# Patient Record
Sex: Male | Born: 1970
Health system: Southern US, Community
[De-identification: ages and names within clinical notes are randomized; demographics above are authoritative.]

## PROBLEM LIST (undated history)

## (undated) DIAGNOSIS — R011 Cardiac murmur, unspecified: Secondary | ICD-10-CM

## (undated) DIAGNOSIS — T7840XA Allergy, unspecified, initial encounter: Secondary | ICD-10-CM

## (undated) DIAGNOSIS — I10 Essential (primary) hypertension: Secondary | ICD-10-CM

## (undated) DIAGNOSIS — K4090 Unilateral inguinal hernia, without obstruction or gangrene, not specified as recurrent: Secondary | ICD-10-CM

## (undated) HISTORY — PX: OTHER SURGICAL HISTORY: SHX169

## (undated) HISTORY — DX: Allergy, unspecified, initial encounter: T78.40XA

## (undated) HISTORY — PX: DENTAL SURGERY: SHX609

## (undated) HISTORY — PX: MOUTH SURGERY: SHX715

## (undated) HISTORY — DX: Cardiac murmur, unspecified: R01.1

## (undated) HISTORY — PX: FINGER FRACTURE SURGERY: SHX638

## (undated) HISTORY — DX: Essential (primary) hypertension: I10

## (undated) HISTORY — PX: WRIST FRACTURE SURGERY: SHX121

## (undated) HISTORY — DX: Unilateral inguinal hernia, without obstruction or gangrene, not specified as recurrent: K40.90

---

## 2012-12-22 ENCOUNTER — Ambulatory Visit (INDEPENDENT_AMBULATORY_CARE_PROVIDER_SITE_OTHER): Payer: 59 | Admitting: Family Medicine

## 2012-12-22 ENCOUNTER — Encounter: Payer: Self-pay | Admitting: Family Medicine

## 2012-12-22 VITALS — BP 129/89 | HR 70 | Resp 14 | Ht 71.0 in | Wt 220.0 lb

## 2012-12-22 DIAGNOSIS — R011 Cardiac murmur, unspecified: Secondary | ICD-10-CM

## 2012-12-22 DIAGNOSIS — L255 Unspecified contact dermatitis due to plants, except food: Secondary | ICD-10-CM

## 2012-12-22 DIAGNOSIS — Z283 Underimmunization status: Secondary | ICD-10-CM

## 2012-12-22 DIAGNOSIS — Z13220 Encounter for screening for lipoid disorders: Secondary | ICD-10-CM

## 2012-12-22 DIAGNOSIS — L237 Allergic contact dermatitis due to plants, except food: Secondary | ICD-10-CM | POA: Insufficient documentation

## 2012-12-22 DIAGNOSIS — Z131 Encounter for screening for diabetes mellitus: Secondary | ICD-10-CM

## 2012-12-22 MED ORDER — TRIAMCINOLONE ACETONIDE 0.1 % EX CREA: TOPICAL_CREAM | Freq: Two times a day (BID) | CUTANEOUS | Status: DC

## 2012-12-22 NOTE — Progress Notes (Signed)
CC: Tyler Moody is a 42 y.o. 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 Discomfort is described as itching moderate severity, slightly improved with over-the-counter hydrocortisone cream. He has gotten this multiple times during the spring and summer when exposed to poison ivy.  Localizes it to both sides of the neck and left flank.  Worsens with scratching nothing else makes better or worse. Symptoms have not gotten better or worse overall since onset.  He denies trouble swallowing, throat closure, swelling of the face, nor difficulty breathing  Patient is unsure whether or not he has had a tetanus booster in the last 10 years  He believes it's been well over 2 years since cholesterol and blood sugar was checked  Review of Systems - General ROS: negative for - chills, fever, night sweats, weight gain or weight loss Ophthalmic ROS: negative for - decreased vision Psychological ROS: negative for - anxiety or depression ENT ROS: negative for - hearing change, nasal congestion, tinnitus or allergies Hematological and Lymphatic ROS: negative for - bleeding problems, bruising or swollen lymph nodes Breast ROS: negative Respiratory ROS: no cough, shortness of breath, or wheezing Cardiovascular ROS: no chest pain or dyspnea on exertion Gastrointestinal ROS: no abdominal pain, change in bowel habits, or black or bloody stools Genito-Urinary ROS: negative for - genital discharge, genital ulcers, incontinence or abnormal bleeding from genitals Musculoskeletal ROS: negative for - joint pain or muscle pain Neurological ROS: negative for - headaches or memory loss Dermatological ROS: negative for lumps, mole changes, rash and skin lesion changes other than that described in history of present illness  Past Medical History  Diagnosis Date  .  Heart murmur      Family History  Problem Relation Age of Onset  . Cancer Mother     breast CA  . Hypertension Father      History  Substance Use Topics  . Smoking status: Never Smoker   . Smokeless tobacco: Not on file  . Alcohol Use: .5 - 1 oz/week    1-2 drink(s) per week     Objective: Filed Vitals:   12/22/12 0847  BP: 129/89  Pulse: 70  Resp: 14    General: Alert and Oriented, No Acute Distress HEENT: Pupils equal, round, reactive to light. Conjunctivae clear.  Moist mucous membranes pharynx unremarkable. No cervical lymphadenopathy Lungs: Clear to auscultation bilaterally, no wheezing/ronchi/rales.  Comfortable work of breathing. Good air movement. Cardiac: Regular rate and rhythm. Normal S1/S2.  Grade 1/6 holosystolic murmur no, rubs, nor gallops.   Extremities: No peripheral edema.  Strong peripheral pulses.  Mental Status: No depression, anxiety, nor agitation. Skin: Warm and dry. Moderate erythema and closely clustered vesicular lesions on the Anterior, left, right sparing the back of the neck, no sign of infection  Assessment & Plan: Tyler Moody was seen today for establish care.  Diagnoses and associated orders for this visit:  Poison ivy dermatitis - triamcinolone cream (KENALOG) 0.1 %; Apply topically 2 (two) times daily. For two weeks.  Heart murmur  Immunization deficiency  Screening, lipid - Lipid panel  Screening for diabetes mellitus - BASIC METABOLIC PANEL WITH GFR    Poison ivy: Patient does not want to be aggressive to the point of starting prednisone but open to the idea of topical triamcinolone. Immunization deficiency: Will wait for outside records prior to Tdap  administration. He is due for a lipid screening and diabetic screening  Return in about 4 weeks (around 01/19/2013) for CPE.

## 2012-12-23 ENCOUNTER — Telehealth: Payer: Self-pay | Admitting: *Deleted

## 2012-12-23 ENCOUNTER — Ambulatory Visit: Payer: Self-pay | Admitting: Family Medicine

## 2012-12-23 DIAGNOSIS — L237 Allergic contact dermatitis due to plants, except food: Secondary | ICD-10-CM

## 2012-12-23 MED ORDER — PREDNISONE 20 MG PO TABS: ORAL_TABLET | ORAL | Status: AC

## 2012-12-23 NOTE — Addendum Note (Signed)
Addended by: Laren Boom on: 12/23/2012 11:42 AM   Modules accepted: Orders

## 2012-12-23 NOTE — Telephone Encounter (Signed)
Pt calls office back and would like to get a rx for prednisone for poison ivy would like this sent to [facility] at [facility]

## 2012-12-23 NOTE — Telephone Encounter (Signed)
Left message to notified pt RX sent to his pharmacy. Fidela Cieslak,LPN

## 2012-12-23 NOTE — Telephone Encounter (Signed)
Pt called reports that his poison ivy is no better, he would like PO prednisone?/ Tyler Celani,LPN

## 2012-12-23 NOTE — Telephone Encounter (Signed)
Oral prednisone taper has been sent to requesting pharmacy.

## 2012-12-24 LAB — LIPID PANEL
Cholesterol: 128 mg/dL (ref 0–200)
LDL Cholesterol: 74 mg/dL (ref 0–99)
Total CHOL/HDL Ratio: 3.4 Ratio
VLDL: 16 mg/dL (ref 0–40)

## 2012-12-24 LAB — BASIC METABOLIC PANEL WITH GFR
BUN: 17 mg/dL (ref 6–23)
Chloride: 102 mEq/L (ref 96–112)
GFR, Est African American: 79 mL/min
GFR, Est Non African American: 69 mL/min
Potassium: 4 mEq/L (ref 3.5–5.3)
Sodium: 138 mEq/L (ref 135–145)

## 2012-12-25 ENCOUNTER — Encounter: Payer: Self-pay | Admitting: Family Medicine

## 2012-12-25 DIAGNOSIS — N2889 Other specified disorders of kidney and ureter: Secondary | ICD-10-CM | POA: Insufficient documentation

## 2012-12-25 DIAGNOSIS — N182 Chronic kidney disease, stage 2 (mild): Secondary | ICD-10-CM | POA: Insufficient documentation

## 2013-01-06 ENCOUNTER — Encounter: Payer: Self-pay | Admitting: Family Medicine

## 2013-01-06 ENCOUNTER — Ambulatory Visit (INDEPENDENT_AMBULATORY_CARE_PROVIDER_SITE_OTHER): Payer: 59 | Admitting: Family Medicine

## 2013-01-06 VITALS — BP 145/95 | HR 66 | Ht 71.0 in | Wt 222.0 lb

## 2013-01-06 DIAGNOSIS — K4090 Unilateral inguinal hernia, without obstruction or gangrene, not specified as recurrent: Secondary | ICD-10-CM

## 2013-01-06 DIAGNOSIS — R030 Elevated blood-pressure reading, without diagnosis of hypertension: Secondary | ICD-10-CM

## 2013-01-06 DIAGNOSIS — Z0000 Encounter for general adult medical examination without abnormal findings: Secondary | ICD-10-CM

## 2013-01-06 HISTORY — DX: Unilateral inguinal hernia, without obstruction or gangrene, not specified as recurrent: K40.90

## 2013-01-06 NOTE — Progress Notes (Signed)
CC: Tyler Moody is a 42 y.o. male is here for Annual Exam   Subjective: HPI:  Colonoscopy: No family history colon cancer. Will start age 66. Prostate: Discussed screening risks/beneifts with patient on 01/06/2013. No lower urinary tract symptoms, we'll begin screening at age 31  Influenza Vaccine: Out of season Pneumovax: No indication currently Td/Tdap: He is unsure of immunization status believes it was within last 10 years, we are awaiting outside records Zoster: (Start 42 yo)  No acute complaints today here for complete physical exam. No tobacco use, rare alcohol use, no recreational drug use. He is getting started on a daily aerobic exercise regimen, he is quite particular about eating healthy.  Over the past 2 weeks have you been bothered by: - Little interest or pleasure in doing things: No - Feeling down depressed or hopeless: No  Review of Systems - General ROS: negative for - chills, fever, night sweats, weight gain or weight loss Ophthalmic ROS: negative for - decreased vision Psychological ROS: negative for - anxiety or depression ENT ROS: negative for - hearing change, nasal congestion, tinnitus or allergies Hematological and Lymphatic ROS: negative for - bleeding problems, bruising or swollen lymph nodes Breast ROS: negative Respiratory ROS: no cough, shortness of breath, or wheezing Cardiovascular ROS: no chest pain or dyspnea on exertion Gastrointestinal ROS: no abdominal pain, change in bowel habits, or black or bloody stools Genito-Urinary ROS: negative for - genital discharge, genital ulcers, incontinence or abnormal bleeding from genitals Musculoskeletal ROS: negative for - joint pain or muscle pain Neurological ROS: negative for - headaches or memory loss Dermatological ROS: negative for lumps, mole changes, rash and skin lesion changes  Past Medical History  Diagnosis Date  . Heart murmur      Family History  Problem Relation Age of Onset  . Cancer  Mother     breast CA  . Hypertension Father      History  Substance Use Topics  . Smoking status: Never Smoker   . Smokeless tobacco: Not on file  . Alcohol Use: .5 - 1 oz/week    1-2 drink(s) per week     Objective: Filed Vitals:   01/06/13 0829  BP: 145/95  Pulse: 66    General: No Acute Distress HEENT: Atraumatic, normocephalic, conjunctivae normal without scleral icterus.  No nasal discharge, hearing grossly intact, TMs with good landmarks bilaterally with no middle ear abnormalities, posterior pharynx clear without oral lesions. Neck: Supple, trachea midline, no cervical nor supraclavicular adenopathy. Pulmonary: Clear to auscultation bilaterally without wheezing, rhonchi, nor rales. Cardiac: Regular rate and rhythm.  No murmurs, rubs, nor gallops. No peripheral edema.  2+ peripheral pulses bilaterally. Abdomen: Bowel sounds normal.  No masses.  Non-tender without rebound.  Negative Murphy's sign. GU:  Penis without lesions.  Bilateral descended non-tender testicles without palpable abnormal masses. There is a very small left inguinal hernia that is painless only palpable with Valsalva MSK: Grossly intact, no signs of weakness.  Full strength throughout upper and lower extremities.  Full ROM in upper and lower extremities.  No midline spinal tenderness. Neuro: Gait unremarkable, CN II-XII grossly intact.  C5-C6 Reflex 2/4 Bilaterally, L4 Reflex 2/4 Bilaterally.  Cerebellar function intact. Skin: No rashes. He does have a slightly inflamed seborrheic her ptosis on the back Psych: Alert and oriented to person/place/time.  Thought process normal. No anxiety/depression.  Assessment & Plan: [name] was seen today for annual exam.  Diagnoses and associated orders for this visit:  Annual physical exam  Elevated  blood pressure  Left inguinal hernia    Healthy lifestyle interventions including but limited to regular exercise, a healthy low fat diet, moderation of salt intake,  the dangers of tobacco/alcohol/recreational drug use, nutrition supplementation, and accident avoidance were discussed with the patient and a handout was provided for future reference.  We discussed clinical and self observation of his left inguinal hernia discussing signs and symptoms that would require urgent or emergent evaluation. Discussed that the risks of surgery currently outweigh the benefits given asymptomatic nature of the hernia.  Discussed exercise and diet to help keep blood pressure in check.   Return in about 3 months (around 04/08/2013) for BP Check.

## 2013-01-06 NOTE — Patient Instructions (Addendum)
Testicular Problems and Self-Exam Men can examine themselves easily and effectively with positive results. Monthly exams detect problems early and save lives. There are numerous causes of swelling in the testicle. Testicular cancer usually appears as a firm painless lump in the front part of the testicle. This may feel like a dull ache or heavy feeling located in the lower abdomen (belly), groin, or scrotum.  The risk is greater in men with undescended testicles and it is more common in young men. It is responsible for almost a fifth of cancers in males between ages 15 and 34. Other common causes of swellings, lumps, and testicular pain include injuries, inflammation (soreness) from infection, hydrocele, and torsion. These are a few of the reasons to do monthly self-examination of the testicles. The exam only takes minutes and could add years to your life. Get in the habit! SELF-EXAMINATION OF THE TESTICLES The testicles are easiest to examine after warm baths or showers and are more difficult to examine when you are cold. This is because the muscles attached to the testicles retract and pull them up higher or into the abdomen. While standing, roll one testicle between the thumb and forefinger. Feel for lumps, swelling, or discomfort. A normal testicle is egg shaped and feels firm. It is smooth and not tender. The spermatic cord can be felt as a firm spaghetti-like cord at the back of the testicle. It is also important to examine your groins. This is the crease between the front of your leg and your abdomen. Also, feel for enlarged lymph nodes (glands). Enlarged nodes are also a cause for you to see your caregiver for evaluation.  Self-examination of the testicles and groin areas on a regular basis will help you to know what your own testicles and groins feel like. This will help you pick up an abnormality (difference) at an earlier stage. Early discovery is the key to curing this cancer or treating other  conditions. Any lump, change, or swelling in the testicle calls for immediate evaluation by your caregiver. Cancer of the testicle does not result in impotence and it does not prevent normal intercourse or prevent having children. If your caregiver feels that medical treatment or chemotherapy could lead to infertility, sperm can be frozen for future use. It is necessary to see a caregiver as soon as possible after the discovery of a lump in a testicle. Document Released: 09/24/2000 Document Revised: 09/10/2011 Document Reviewed: 06/19/2008 Exit[employer] Patient Information 2014 [employer].  Dr. Ranier Coach's General Advice Following Your Complete Physical Exam  The Benefits of Regular Exercise: Unless you suffer from an uncontrolled cardiovascular condition, studies strongly suggest that regular exercise and physical activity will add to both the quality and length of your life.  The World Health Organization recommends 150 minutes of moderate intensity aerobic activity every week.  This is best split over 3-4 days a week, and can be as simple as a brisk walk for just over 35 minutes "most days of the week".  This type of exercise has been shown to lower LDL-Cholesterol, lower average blood sugars, lower blood pressure, lower cardiovascular disease risk, improve memory, and increase one's overall sense of wellbeing.  The addition of anaerobic (or "strength training") exercises offers additional benefits including but not limited to increased metabolism, prevention of osteoporosis, and improved overall cholesterol levels.  How Can I Strive For A Low-Fat Diet?: Current guidelines recommend that 25-35 percent of your daily energy (food) intake should come from fats.  One might ask how   can this be achieved without having to dissect each meal on a daily basis?  Switch to skim or 1% milk instead of whole milk.  Focus on lean meats such as ground turkey, fresh fish, baked chicken, and lean cuts of beef as your  source of dietary protein.  Consume less than 300mg/day of dietary cholesterol.  Limit trans fatty acid consumption primarily by limiting synthetic trans fats such as partially hydrogenated oils (Ex: fried fast foods).  Focus efforts on reducing your intake of "solid" fats (Ex: Butter).  Substitute olive or vegetable oil for solid fats where possible.  Moderation of Salt Intake: Provided you don't carry a diagnosis of congestive heart failure nor renal failure, I recommend a daily allowance of no more than 2300 mg of salt (sodium).  Keeping under this daily goal is associated with a decreased risk of cardiovascular events, creeping above it can lead to elevated blood pressures and increases your risk of cardiovascular events.  Milligrams (mg) of salt is listed on all nutrition labels, and your daily intake can add up faster than you think.  Most canned and frozen dinners can pack in over half your daily salt allowance in one meal.    Lifestyle Health Risks: Certain lifestyle choices carry specific health risks.  As you may already know, tobacco use has been associated with increasing one's risk of cardiovascular disease, pulmonary disease, numerous cancers, among many other issues.  What you may not know is that there are medications and nicotine replacement strategies that can more than double your chances of successfully quitting.  I would be thrilled to help manage your quitting strategy if you currently use tobacco products.  When it comes to alcohol use, I've yet to find an "ideal" daily allowance.  Provided an individual does not have a medical condition that is exacerbated by alcohol consumption, general guidelines determine "safe drinking" as no more than two standard drinks for a man or no more than one standard drink for a male per day.  However, much debate still exists on whether any amount of alcohol consumption is technically "safe".  My general advice, keep alcohol consumption to a  minimum for general health promotion.  If you or others believe that alcohol, tobacco, or recreational drug use is interfering with your life, I would be happy to provide confidential counseling regarding treatment options.  General "Over The Counter" Nutrition Advice: Postmenopausal women should aim for a daily calcium intake of 1200 mg, however a significant portion of this might already be provided by diets including milk, yogurt, cheese, and other dairy products.  Vitamin D has been shown to help preserve bone density, prevent fatigue, and has even been shown to help reduce falls in the elderly.  Ensuring a daily intake of 800 Units of Vitamin D is a good place to start to enjoy the above benefits, we can easily check your Vitamin D level to see if you'd potentially benefit from supplementation beyond 800 Units a day.  Folic Acid intake should be of particular concern to women of childbearing age.  Daily consumption of 400-800 mcg of Folic Acid is recommended to minimize the chance of spinal cord defects in a fetus should pregnancy occur.    For many adults, accidents still remain one of the most common culprits when it comes to cause of death.  Some of the simplest but most effective preventitive habits you can adopt include regular seatbelt use, proper helmet use, securing firearms, and regularly testing your smoke and   carbon monoxide detectors.  Mikaela Hilgeman B. Moris Ratchford DO [facility] [address], [address] [place], [place] [address] Phone: [phone]  

## 2013-04-07 ENCOUNTER — Ambulatory Visit (INDEPENDENT_AMBULATORY_CARE_PROVIDER_SITE_OTHER): Payer: 59 | Admitting: Family Medicine

## 2013-04-07 ENCOUNTER — Encounter: Payer: Self-pay | Admitting: Family Medicine

## 2013-04-07 VITALS — BP 136/84 | HR 64 | Wt 219.0 lb

## 2013-04-07 DIAGNOSIS — R030 Elevated blood-pressure reading, without diagnosis of hypertension: Secondary | ICD-10-CM

## 2013-04-07 DIAGNOSIS — R011 Cardiac murmur, unspecified: Secondary | ICD-10-CM

## 2013-04-07 DIAGNOSIS — L820 Inflamed seborrheic keratosis: Secondary | ICD-10-CM

## 2013-04-07 NOTE — Progress Notes (Signed)
CC: Tyler Moody is a 42 y.o. male is here for Hypertension and mole removal   Subjective: HPI:  Followup of elevated blood pressure: Patient has been introducing more fruits in his diet decreasing salt intake exercising most days of the week one hour at a time using elliptical. Has lost 6 pounds since I saw her last. No outside blood pressures to report. Denies chest pain, shortness of breath, edema, motor sensory disturbances  Patient continues to have irritation on the back localizes it to just medial to the right shoulder blade. Pain is described as mild to moderate described only as irritation nonradiating worse with tightfitting shirts nothing else makes better or worse. No personal or family history of skin cancer.   Review Of Systems Outlined In HPI  Past Medical History  Diagnosis Date  . Heart murmur      Family History  Problem Relation Age of Onset  . Cancer Mother     breast CA  . Hypertension Father      History  Substance Use Topics  . Smoking status: Never Smoker   . Smokeless tobacco: Not on file  . Alcohol Use: .5 - 1 oz/week    1-2 drink(s) per week     Objective: Filed Vitals:   04/07/13 0822  BP: 136/84  Pulse: 64    General: Alert and Oriented, No Acute Distress HEENT: Pupils equal, round, reactive to light. Conjunctivae clear.  Moist membranes pharynx unremarkable Lungs: Clear to auscultation bilaterally, no wheezing/ronchi/rales.  Comfortable work of breathing. Good air movement. Cardiac: Regular rate and rhythm. Normal S1/S2.  No murmurs, rubs, nor gallops.   Extremities: No peripheral edema.  Strong peripheral pulses.  Mental Status: No depression, anxiety, nor agitation. Skin: Warm and dry. There is a 1 cm by half centimeter raised waxy appearing fleshy colored lesion back with substantial erythema and discomfort to touch  Assessment & Plan: Tyler Moody was seen today for hypertension and mole removal.  Diagnoses and associated orders for  this visit:  Elevated blood pressure  Heart murmur  Seborrheic keratoses, inflamed    Elevated blood pressure: Improved with diet and exercise congratulated his success encouraged to continue interventions repeat blood pressure check 3 months Heart murmur: No longer appreciable Seborrheic keratosis: Discussed cryotherapy versus Curettage patient prefers cryotherapy, return if not resolved in 2 weeks.   Return in about 3 months (around 07/08/2013).  Cryotherapy Procedure Note  Pre-operative Diagnosis: Seb keratosis  Post-operative Diagnosis: same  Locations: right back medial to scapula  Indications: pain  Anesthesia: none  Procedure Details  History of allergy to iodine: no. Pacemaker? no.  Patient informed of risks (permanent scarring, infection, light or dark discoloration, bleeding, infection, weakness, numbness and recurrence of the lesion) and benefits of the procedure and verbal informed consent obtained.  The areas are treated with liquid nitrogen therapy, frozen until ice ball extended 2 mm beyond lesion, allowed to thaw, and treated again. The patient tolerated procedure well.  The patient was instructed on post-op care, warned that there may be blister formation, redness and pain. Recommend OTC analgesia as needed for pain.  Condition: Stable  Complications: none.  Plan: 1. Instructed to keep the area dry and covered for 24-48h and clean thereafter. 2. Warning signs of infection were reviewed.   3. Recommended that the patient use OTC analgesics as needed for pain.  4. Return PRN

## 2013-04-22 ENCOUNTER — Ambulatory Visit: Payer: 59 | Admitting: Family Medicine

## 2013-04-22 ENCOUNTER — Encounter: Payer: Self-pay | Admitting: Family Medicine

## 2013-04-22 VITALS — BP 134/89 | HR 60 | Wt 215.0 lb

## 2013-04-22 DIAGNOSIS — L821 Other seborrheic keratosis: Secondary | ICD-10-CM

## 2013-04-22 NOTE — Progress Notes (Signed)
Patient returns approximately 2 weeks after cryotherapy to seborrheic keratoses on the back, he reports a light film fell off of it days after cryotherapy however it is back to feeling tender like he was before cryotherapy and remains raised.  There is a single separate keratoses on the back near the right scapula shows mild inflammation its height has been unchanged since last cryotherapy. Freeze thaw freeze cycle was again applied, if this persists without scabbing off we'll consider Cantharidin next.  He has a small wart on the right index finger that we also used cryotherapy on for no charge as we would be discarding the left over liquid nitrogen anyway.  Return in 2 weeks if no improvement of the back

## 2013-05-13 ENCOUNTER — Encounter: Payer: Self-pay | Admitting: Family Medicine

## 2013-05-13 ENCOUNTER — Ambulatory Visit (INDEPENDENT_AMBULATORY_CARE_PROVIDER_SITE_OTHER): Payer: 59 | Admitting: Family Medicine

## 2013-05-13 VITALS — BP 142/96 | HR 85 | Wt 218.0 lb

## 2013-05-13 DIAGNOSIS — I781 Nevus, non-neoplastic: Secondary | ICD-10-CM

## 2013-05-13 DIAGNOSIS — I789 Disease of capillaries, unspecified: Secondary | ICD-10-CM

## 2013-05-13 DIAGNOSIS — L989 Disorder of the skin and subcutaneous tissue, unspecified: Secondary | ICD-10-CM

## 2013-05-13 NOTE — Progress Notes (Signed)
CC: Tyler Moody is a 42 y.o. male is here for recheck SK   Subjective: HPI:  Patient is concerned that seborrheic keratosis that received cryotherapy twice on the back is not completely removed. He describes the location as raised and firm but without pain. He denies discharge, nor drainage since the last cryotherapy time.. he's keeping the wound covered but there's been no other interventions.  He also is concerned about a growth on his left cheek that has been present for at least 3 months likely longer. Occasionally bleeds from shaving it seems that it gets irritated the more frequently he shaves.  Over the past months it is becoming slightly more and more raised. He has a family history of melanoma in his sister but no other known skin cancers. He denies unintentional weight loss, swollen lymph nodes, easy bruisability nor trouble fighting infections  Review Of Systems Outlined In HPI  Past Medical History  Diagnosis Date  . Heart murmur      Family History  Problem Relation Age of Onset  . Cancer Mother     breast CA  . Hypertension Father      History  Substance Use Topics  . Smoking status: Never Smoker   . Smokeless tobacco: Not on file  . Alcohol Use: .5 - 1 oz/week    1-2 drink(s) per week     Objective: Filed Vitals:   05/13/13 1604  BP: 142/96  Pulse: 85    Vital signs reviewed. General: Alert and Oriented, No Acute Distress HEENT: Pupils equal, round, reactive to light. Conjunctivae clear.  External ears unremarkable.  Moist mucous membranes. Lungs: Clear and comfortable work of breathing, speaking in full sentences without accessory muscle use. Cardiac: Regular rate and rhythm.  Neuro: CN II-XII grossly intact, gait normal. Extremities: No peripheral edema.  Strong peripheral pulses.  Mental Status: No depression, anxiety, nor agitation. Logical though process. Skin: Warm and dry. The former seborrheic keratosis site on the back appears noninflamed, I  believe it is healing well without signs of infection. On his left cheek he has a 3 mm diameter slightly raised lesion with faint pigmentation however I do think that I see some telangiectasia in this lesion  Assessment & Plan: Tyler Moody was seen today for recheck sk.  Diagnoses and associated orders for this visit:  Skin lesion of face - Ambulatory referral to Dermatology  Telangiectasia - Ambulatory referral to Dermatology    Reassurance provided at seborrheic keratosis site on the back is healing well skin should appear normal in the next one to 2 months of healing  There is concern for basal cell carcinoma on the left cheek I am sending him to dermatology for further evaluation in hopes of maybe saving him a biopsy on his face.  Return if symptoms worsen or fail to improve.

## 2013-06-16 ENCOUNTER — Encounter: Payer: Self-pay | Admitting: Family Medicine

## 2013-07-07 ENCOUNTER — Ambulatory Visit (INDEPENDENT_AMBULATORY_CARE_PROVIDER_SITE_OTHER): Payer: 59 | Admitting: Family Medicine

## 2013-07-07 ENCOUNTER — Encounter: Payer: Self-pay | Admitting: Family Medicine

## 2013-07-07 VITALS — BP 138/88 | HR 74 | Wt 221.0 lb

## 2013-07-07 DIAGNOSIS — R030 Elevated blood-pressure reading, without diagnosis of hypertension: Secondary | ICD-10-CM

## 2013-07-07 DIAGNOSIS — IMO0001 Reserved for inherently not codable concepts without codable children: Secondary | ICD-10-CM

## 2013-07-07 DIAGNOSIS — J069 Acute upper respiratory infection, unspecified: Secondary | ICD-10-CM

## 2013-07-07 NOTE — Progress Notes (Signed)
CC: Tyler Moody is a 43 y.o. male is here for Follow-up   Subjective: HPI:  Followup elevated blood pressure: Once the holidays arrived patient admits that he was slacking with respect to elliptical machine, was spending more hours secondary with his family, difficulty sticking to a strict low-sodium diet. He believes these behaviors have likely contributed to worsening blood pressure. No outside blood pressures to report Denies chest pain, shortness of breath, orthopnea, peripheral edema, motor sensory disturbances.  Patient complains of lingering nasal congestion with mild cough that is worse in the morning pretty much absent in the afternoons. Mild in severity at its worst. No interventions as of yet. Has been present for the past 2-3 weeks. Has not been getting better or worse overall. Denies fevers, chills, shortness of breath, facial pressure, sore throat, nor myalgias   Review Of Systems Outlined In HPI  Past Medical History  Diagnosis Date  . Heart murmur      Family History  Problem Relation Age of Onset  . Cancer Mother     breast CA  . Hypertension Father      History  Substance Use Topics  . Smoking status: Never Smoker   . Smokeless tobacco: Not on file  . Alcohol Use: .5 - 1 oz/week    1-2 drink(s) per week     Objective: Filed Vitals:   07/07/13 0819  BP: 138/88  Pulse: 74    General: Alert and Oriented, No Acute Distress HEENT: Pupils equal, round, reactive to light. Conjunctivae clear.  External ears unremarkable, canals clear with intact TMs with appropriate landmarks.  Middle ear appears open without effusion. Pink inferior turbinates.  Moist mucous membranes, pharynx without inflammation nor lesions.  Neck supple without palpable lymphadenopathy nor abnormal masses. Lungs: Clear to auscultation bilaterally, no wheezing/ronchi/rales.  Comfortable work of breathing. Good air movement. Cardiac: Regular rate and rhythm. Normal S1/S2.  No murmurs, rubs, nor  gallops.   Mental Status: No depression, anxiety, nor agitation. Skin: Warm and dry.  Assessment & Plan: Tyler Moody was seen today for follow-up.  Diagnoses and associated orders for this visit:  Elevated blood pressure  Viral URI    Elevated blood pressure: Controlled but still in the pre-hypertensive state, we discussed focusing more attention on diet and more importantly exercise discussed treadmill regimens and elliptical regimens using intervals.  We'll hold off on antihypertensives at this time given his motivated attitude towards lifestyle changes. Recheck in 3 months Viral URI: Reassurance given that his symptoms should be self resolving within the next one or 2 weeks. Encouraged to consider taking vitamin C and zinc supplementation and over-the-counter formulations  25 minutes spent face-to-face during visit today of which at least 50% was counseling or coordinating care regarding: 1. Elevated blood pressure   2. Viral URI      Return in about 3 months (around 10/05/2013) for BP.

## 2013-10-05 ENCOUNTER — Ambulatory Visit: Payer: 59 | Admitting: Family Medicine

## 2013-10-12 ENCOUNTER — Encounter: Payer: Self-pay | Admitting: Family Medicine

## 2013-10-12 ENCOUNTER — Ambulatory Visit (INDEPENDENT_AMBULATORY_CARE_PROVIDER_SITE_OTHER): Payer: 59 | Admitting: Family Medicine

## 2013-10-12 VITALS — BP 143/94 | HR 53 | Wt 223.0 lb

## 2013-10-12 DIAGNOSIS — IMO0002 Reserved for concepts with insufficient information to code with codable children: Secondary | ICD-10-CM

## 2013-10-12 DIAGNOSIS — I10 Essential (primary) hypertension: Secondary | ICD-10-CM

## 2013-10-12 DIAGNOSIS — M1710 Unilateral primary osteoarthritis, unspecified knee: Secondary | ICD-10-CM

## 2013-10-12 NOTE — Progress Notes (Signed)
CC: Tyler Moody is a 43 y.o. male is here for Hypertension   Subjective: HPI:  Followup elevated blood pressure: no outside blood pressures to report, he's been unable to engage in exercise in due to problems listed below with his knee. He denies exertional chest pain, shortness of breath, orthopnea, peripheral edema, nor irregular heartbeat. Denies motor or sensory disturbances   Complains of daily left knee pain that has been present ever since he landed on his knee while ice skating. Symptoms came on within a matter of hours. Pain began one month ago and has been slowly improving with stretching and glucosamine. Pain is worse when going down stairs or trying to use the elliptical machine. Nothing else particularly makes pain better or worse. Denies catching, locking, giving way, nor weakness. Denies overlying skin changes nor swelling, redness, nor warmth.   Review Of Systems Outlined In HPI  Past Medical History  Diagnosis Date  . Heart murmur     Past Surgical History  Procedure Laterality Date  . Finger fracture surgery Left     thumb   Family History  Problem Relation Age of Onset  . Cancer Mother     breast CA  . Hypertension Father     History   Social History  . Marital Status: Married    Spouse Name: N/A    Number of Children: N/A  . Years of Education: N/A   Occupational History  . Not on file.   Social History Main Topics  . Smoking status: Never Smoker   . Smokeless tobacco: Not on file  . Alcohol Use: 0.5 - 1.0 oz/week    1-2 drink(s) per week  . Drug Use: No  . Sexual Activity: Yes    Partners: Female   Other Topics Concern  . Not on file   Social History Narrative  . No narrative on file     Objective: BP 143/94  Pulse 53  Wt 223 lb (101.152 kg)  General: Alert and Oriented, No Acute Distress HEENT: Pupils equal, round, reactive to light. Conjunctivae clear.  Moist mucous membranes Lungs: Clear and comfortable work of  breathing Cardiac: Regular rate and rhythm.  Extremities: No peripheral edema.  Strong peripheral pulses. Left knee exam shows full-strength and range of motion. There is no swelling, redness, nor warmth overlying the knee.  Mild to moderate patellar crepitus. No patellar apprehension. No pain with palpation of the inferior patellar pole.  Pain is reproduced with lateral and medial patellar deviation. No pain or laxity with valgus nor varus stress. Anterior drawer is negative. McMurray's negative. No popliteal space tenderness or palpable mass. No medial or lateral joint line tenderness to palpation. Mental Status: No depression, anxiety, nor agitation. Skin: Warm and dry.  Assessment & Plan: Tyler Moody was seen today for hypertension.  Diagnoses and associated orders for this visit:  Essential hypertension  Patellofemoral arthritis    Essential hypertension: Uncontrolled chronic condition I encouraged him to start on hydrochlorothiazide today, he politely declines and would like to continue on exercise interventions to help lower blood pressure. Recheck in one month after he restarts exercise routine. Patellofemoral arthritis: He was given a handout on stretching and exercises for rehabilitation at home. Anti-inflammatories as needed. Anticipate this will resolve completely within the next 2-3 weeks if he does rehabilitation at home daily.  Return in about 4 weeks (around 11/09/2013).

## 2013-11-09 ENCOUNTER — Encounter: Payer: Self-pay | Admitting: Family Medicine

## 2013-11-09 ENCOUNTER — Ambulatory Visit (INDEPENDENT_AMBULATORY_CARE_PROVIDER_SITE_OTHER): Payer: 59 | Admitting: Family Medicine

## 2013-11-09 VITALS — BP 134/85 | HR 67 | Wt 228.0 lb

## 2013-11-09 DIAGNOSIS — I10 Essential (primary) hypertension: Secondary | ICD-10-CM

## 2013-11-09 MED ORDER — AMBULATORY NON FORMULARY MEDICATION: Status: DC

## 2013-11-09 NOTE — Progress Notes (Signed)
CC: Tyler Moody is a 43 y.o. male is here for Hypertension   Subjective: HPI:  Followup essential hypertension: Continues to not take any antihypertensives. Continues to watch his salt intake and has been exercising most days of the week on his elliptical machine. He would like to purchase a treadmill with hopes of crosstraining.  Denies chest pain, shortness of breath, orthopnea, peripheral edema, nor motor or sensory disturbances. He's been unable to identify anything that makes blood pressure better or worse and has no outside blood pressures to report   Review Of Systems Outlined In HPI  Past Medical History  Diagnosis Date  . Heart murmur     Past Surgical History  Procedure Laterality Date  . Finger fracture surgery Left     thumb   Family History  Problem Relation Age of Onset  . Cancer Mother     breast CA  . Hypertension Father     History   Social History  . Marital Status: Married    Spouse Name: N/A    Number of Children: N/A  . Years of Education: N/A   Occupational History  . Not on file.   Social History Main Topics  . Smoking status: Never Smoker   . Smokeless tobacco: Not on file  . Alcohol Use: 0.5 - 1.0 oz/week    1-2 drink(s) per week  . Drug Use: No  . Sexual Activity: Yes    Partners: Female   Other Topics Concern  . Not on file   Social History Narrative  . No narrative on file     Objective: BP 134/85  Pulse 67  Wt 228 lb (103.42 kg)  General: Alert and Oriented, No Acute Distress HEENT: Pupils equal, round, reactive to light. Conjunctivae clear.  Moist mucous membranes pharynx unremarkable Lungs: Clear to auscultation bilaterally, no wheezing/ronchi/rales.  Comfortable work of breathing. Good air movement. Cardiac: Regular rate and rhythm. Normal S1/S2.  No murmurs, rubs, nor gallops.   Extremities: No peripheral edema.  Strong peripheral pulses.  Mental Status: No depression, anxiety, nor agitation. Skin: Warm and  dry.  Assessment & Plan: Tyler Moody was seen today for hypertension.  Diagnoses and associated orders for this visit:  Essential hypertension - AMBULATORY NON FORMULARY MEDICATION; Running/Walking Treadmill - Use 30-45 minutes a day to help with BP and Weight control.    Essential hypertension: Controlled however discussed that he is in the pre-hypertensive range this should improve with increasing physical activity and keeping sodium intake to less than 1500 mg a day. Prescription for treadmill provided in hopes of him being able to use this with his flexible spending account   Return in about 3 months (around 02/09/2014).

## 2013-11-10 ENCOUNTER — Encounter: Payer: Self-pay | Admitting: Family Medicine

## 2014-02-09 ENCOUNTER — Ambulatory Visit: Payer: 59 | Admitting: Family Medicine

## 2014-02-21 ENCOUNTER — Emergency Department (HOSPITAL_BASED_OUTPATIENT_CLINIC_OR_DEPARTMENT_OTHER)
Admission: EM | Admit: 2014-02-21 | Discharge: 2014-02-21 | Disposition: A | Discharge status: Home / Self Care | Payer: 59 | Attending: Emergency Medicine | Admitting: Emergency Medicine

## 2014-02-21 ENCOUNTER — Encounter (HOSPITAL_BASED_OUTPATIENT_CLINIC_OR_DEPARTMENT_OTHER): Payer: Self-pay | Admitting: Emergency Medicine

## 2014-02-21 DIAGNOSIS — IMO0002 Reserved for concepts with insufficient information to code with codable children: Secondary | ICD-10-CM | POA: Diagnosis not present

## 2014-02-21 DIAGNOSIS — R011 Cardiac murmur, unspecified: Secondary | ICD-10-CM | POA: Diagnosis not present

## 2014-02-21 DIAGNOSIS — Y9389 Activity, other specified: Secondary | ICD-10-CM | POA: Diagnosis not present

## 2014-02-21 DIAGNOSIS — Z23 Encounter for immunization: Secondary | ICD-10-CM | POA: Insufficient documentation

## 2014-02-21 DIAGNOSIS — S0180XA Unspecified open wound of other part of head, initial encounter: Secondary | ICD-10-CM | POA: Diagnosis not present

## 2014-02-21 DIAGNOSIS — S0181XA Laceration without foreign body of other part of head, initial encounter: Secondary | ICD-10-CM

## 2014-02-21 DIAGNOSIS — Y929 Unspecified place or not applicable: Secondary | ICD-10-CM | POA: Insufficient documentation

## 2014-02-21 MED ORDER — TETANUS-DIPHTH-ACELL PERTUSSIS 5-2.5-18.5 LF-MCG/0.5 IM SUSP
0.5000 mL | Freq: Once | INTRAMUSCULAR | Status: AC
  Administered 2014-02-21: 0.5 mL via INTRAMUSCULAR
  Filled 2014-02-21: qty 0.5

## 2014-02-21 NOTE — ED Provider Notes (Signed)
CSN: [id number]     Arrival date & time 02/21/14  1555 History   First MD Initiated Contact with Patient 02/21/14 1632     Chief Complaint  Patient presents with  . Head Laceration     (Consider location/radiation/quality/duration/timing/severity/associated sxs/prior Treatment) Patient is a 43 y.o. male presenting with scalp laceration. The history is provided by the patient. No language interpreter was used.  Head Laceration This is a new problem. The current episode started today. Pertinent negatives include no fever, nausea or neck pain. Associated symptoms comments: Laceration to forehead caused by hitting his head against a water slide earlier today. No LOC, nausea, visual impairment or other injury..    Past Medical History  Diagnosis Date  . Heart murmur    Past Surgical History  Procedure Laterality Date  . Finger fracture surgery Left     thumb   Family History  Problem Relation Age of Onset  . Cancer Mother     breast CA  . Hypertension Father    History  Substance Use Topics  . Smoking status: Never Smoker   . Smokeless tobacco: Not on file  . Alcohol Use: 0.5 - 1.0 oz/week    1-2 drink(s) per week    Review of Systems  Constitutional: Negative for fever.  Eyes: Negative for visual disturbance.  Gastrointestinal: Negative for nausea.  Musculoskeletal: Negative for neck pain.  Skin: Negative for wound.      Allergies  Review of patient's allergies indicates no known allergies.  Home Medications   Prior to Admission medications   Medication Sig Start Date End Date Taking? Authorizing Provider  AMBULATORY NON FORMULARY MEDICATION Running/Walking Treadmill - Use 30-45 minutes a day to help with BP and Weight control. 11/09/13   Sean Hommel, DO   BP 133/89  Pulse 78  Temp(Src) 97.5 F (36.4 C) (Oral)  Resp 20  Ht 5\' 11"  (1.803 m)  Wt 225 lb (102.059 kg)  BMI 31.39 kg/m2  SpO2 97% Physical Exam  Constitutional: He is oriented to person, place,  and time. He appears well-developed and well-nourished. No distress.  Eyes: Pupils are equal, round, and reactive to light.  Neck: Normal range of motion.  Pulmonary/Chest: Effort normal.  Musculoskeletal: Normal range of motion.  Neurological: He is alert and oriented to person, place, and time. Coordination normal.  Skin: Skin is warm and dry.  1 cm partial thickness laceration to central upper forehead with associated hematoma.   Psychiatric: He has a normal mood and affect.    ED Course  Procedures (including critical care time) Labs Review Labs Reviewed - No data to display  Imaging Review No results found.   EKG Interpretation None     LACERATION REPAIR Performed by: Charlann Lange A Authorized by: Charlann Lange A Consent: Verbal consent obtained. Risks and benefits: risks, benefits and alternatives were discussed Consent given by: patient Patient identity confirmed: provided demographic data Prepped and Draped in normal sterile fashion Wound explored  Laceration Location: forehead  Laceration Length: 1cm  No Foreign Bodies seen or palpated  Anesthesia: local infiltration  Local anesthetic: lidocaine n/a% n/a epinephrine  Anesthetic total: n/a ml  Irrigation method: syringe Amount of cleaning: standard  Skin closure: dermabond  Number of sutures: n/a  Technique: dermabond  Patient tolerance: Patient tolerated the procedure well with no immediate complications.  MDM   Final diagnoses:  None    1. Facial laceration  No symptoms or neurologic exam deficit to suggest IC head injury. Laceration repaired.  Tetanus updated. Stable for discharge.     Dewaine Oats, PA-C 02/21/14 1740

## 2014-02-21 NOTE — ED Provider Notes (Signed)
Pt seen and evaluated.  History of head vs waterslide wall.  No LOC or Neuro symptoms or findings. Pt concerned bc UC led him to believe he would receive a CT scan.  Reviewed indications c patient.  Comfortable with clinical exam and no CT.  Tanna Furry, MD 02/21/14 [phone]

## 2014-02-21 NOTE — Discharge Instructions (Signed)
Facial Laceration ° A facial laceration is a cut on the face. These injuries can be painful and cause bleeding. Lacerations usually heal quickly, but they need special care to reduce scarring. °DIAGNOSIS  °Your health care provider will take a medical history, ask for details about how the injury occurred, and examine the wound to determine how deep the cut is. °TREATMENT  °Some facial lacerations may not require closure. Others may not be able to be closed because of an increased risk of infection. The risk of infection and the chance for successful closure will depend on various factors, including the amount of time since the injury occurred. °The wound may be cleaned to help prevent infection. If closure is appropriate, pain medicines may be given if needed. Your health care provider will use stitches (sutures), wound glue (adhesive), or skin adhesive strips to repair the laceration. These tools bring the skin edges together to allow for faster healing and a better cosmetic outcome. If needed, you may also be given a tetanus shot. °HOME CARE INSTRUCTIONS °· Only take over-the-counter or prescription medicines as directed by your health care provider. °· Follow your health care provider's instructions for wound care. These instructions will vary depending on the technique used for closing the wound. °For Sutures: °· Keep the wound clean and dry.   °· If you were given a bandage (dressing), you should change it at least once a day. Also change the dressing if it becomes wet or dirty, or as directed by your health care provider.   °· Wash the wound with soap and water 2 times a day. Rinse the wound off with water to remove all soap. Pat the wound dry with a clean towel.   °· After cleaning, apply a thin layer of the antibiotic ointment recommended by your health care provider. This will help prevent infection and keep the dressing from sticking.   °· You may shower as usual after the first 24 hours. Do not soak the  wound in water until the sutures are removed.   °· Get your sutures removed as directed by your health care provider. With facial lacerations, sutures should usually be taken out after 4-5 days to avoid stitch marks.   °· Wait a few days after your sutures are removed before applying any makeup. °For Skin Adhesive Strips: °· Keep the wound clean and dry.   °· Do not get the skin adhesive strips wet. You may bathe carefully, using caution to keep the wound dry.   °· If the wound gets wet, pat it dry with a clean towel.   °· Skin adhesive strips will fall off on their own. You may trim the strips as the wound heals. Do not remove skin adhesive strips that are still stuck to the wound. They will fall off in time.   °For Wound Adhesive: °· You may briefly wet your wound in the shower or bath. Do not soak or scrub the wound. Do not swim. Avoid periods of heavy sweating until the skin adhesive has fallen off on its own. After showering or bathing, gently pat the wound dry with a clean towel.   °· Do not apply liquid medicine, cream medicine, ointment medicine, or makeup to your wound while the skin adhesive is in place. This may loosen the film before your wound is healed.   °· If a dressing is placed over the wound, be careful not to apply tape directly over the skin adhesive. This may cause the adhesive to be pulled off before the wound is healed.   °· Avoid   prolonged exposure to sunlight or tanning lamps while the skin adhesive is in place. °· The skin adhesive will usually remain in place for 5-10 days, then naturally fall off the skin. Do not pick at the adhesive film.   °After Healing: °Once the wound has healed, cover the wound with sunscreen during the day for 1 full year. This can help minimize scarring. Exposure to ultraviolet light in the first year will darken the scar. It can take 1-2 years for the scar to lose its redness and to heal completely.  °SEEK IMMEDIATE MEDICAL CARE IF: °· You have redness, pain, or  swelling around the wound.   °· You see a yellowish-white fluid (pus) coming from the wound.   °· You have chills or a fever.   °MAKE SURE YOU: °· Understand these instructions. °· Will watch your condition. °· Will get help right away if you are not doing well or get worse. °Document Released: 07/26/2004 Document Revised: 04/08/2013 Document Reviewed: 01/29/2013 °[employer]® Patient Information ©2015 [employer]. This information is not intended to replace advice given to you by your health care provider. Make sure you discuss any questions you have with your health care provider. °Tissue Adhesive Wound Care °Some cuts, wounds, lacerations, and incisions can be repaired by using tissue adhesive. Tissue adhesive is like glue. It holds the skin together, allowing for faster healing. It forms a strong bond on the skin in about 1 minute and reaches its full strength in about 2 or 3 minutes. The adhesive disappears naturally while the wound is healing. It is important to take proper care of your wound at home while it heals.  °HOME CARE INSTRUCTIONS  °Showers are allowed. Do not soak the area containing the tissue adhesive. Do not take baths, swim, or use hot tubs. Do not use any soaps or ointments on the wound. Certain ointments can weaken the glue. °If a bandage (dressing) has been applied, follow your health care provider's instructions for how often to change the dressing.   °Keep the dressing dry if one has been applied.   °Do not scratch, pick, or rub the adhesive.   °Do not place tape over the adhesive. The adhesive could come off when pulling the tape off.   °Protect the wound from further injury until it is healed.   °Protect the wound from sun and tanning bed exposure while it is healing and for several weeks after healing.   °Only take over-the-counter or prescription medicines as directed by your health care provider.   °Keep all follow-up appointments as directed by your health care provider. °SEEK  IMMEDIATE MEDICAL CARE IF:  °Your wound becomes red, swollen, hot, or tender.   °You develop a rash after the glue is applied. °You have increasing pain in the wound.   °You have a red streak that goes away from the wound.   °You have pus coming from the wound.   °You have increased bleeding. °You have a fever. °You have shaking chills.   °You notice a bad smell coming from the wound.   °Your wound or adhesive breaks open.   °MAKE SURE YOU:  °Understand these instructions. °Will watch your condition. °Will get help right away if you are not doing well or get worse. °Document Released: 12/12/2000 Document Revised: 04/08/2013 Document Reviewed: 01/07/2013 °[employer]® Patient Information ©2015 [employer]. This information is not intended to replace advice given to you by your health care provider. Make sure you discuss any questions you have with your health care provider. ° °

## 2014-02-21 NOTE — ED Notes (Signed)
Pt is here after [place].  Pt has swelling to forehead with band aid in place, reports 2 lacerations, no bleeding at this time.  Pt denies any LOC.

## 2014-02-23 NOTE — ED Provider Notes (Signed)
Medical screening examination/treatment/procedure(s) were conducted as a shared visit with non-physician practitioner(s) and myself.  I personally evaluated the patient during the encounter.   EKG Interpretation None      Please see my additional dictation regarding face to face evaluation.  Tanna Furry, MD 02/23/14 [phone]

## 2014-02-26 ENCOUNTER — Encounter: Payer: Self-pay | Admitting: Family Medicine

## 2014-02-26 ENCOUNTER — Ambulatory Visit (INDEPENDENT_AMBULATORY_CARE_PROVIDER_SITE_OTHER): Payer: 59 | Admitting: Family Medicine

## 2014-02-26 VITALS — BP 137/95 | HR 69 | Wt 232.0 lb

## 2014-02-26 DIAGNOSIS — I10 Essential (primary) hypertension: Secondary | ICD-10-CM

## 2014-02-26 NOTE — Progress Notes (Signed)
CC: Tyler Moody is a 43 y.o. male is here for Hypertension   Subjective: HPI:  Followup essential hypertension: Since I saw him last he's been exercising a couple times a week with his treadmill. However over the past week he's been working out most days of the week for a full hour. He's been having difficulty with a concern about his diet however over the past week he tells me he has made a concerned effort to start eating more healthy. He requests guidance on dietary changes to help lower blood pressure. Denies chest pain shortness of breath orthopnea peripheral edema nor motor or sensory disturbances.     Review Of Systems Outlined In HPI  Past Medical History  Diagnosis Date  . Heart murmur     Past Surgical History  Procedure Laterality Date  . Finger fracture surgery Left     thumb   Family History  Problem Relation Age of Onset  . Cancer Mother     breast CA  . Hypertension Father     History   Social History  . Marital Status: Married    Spouse Name: N/A    Number of Children: N/A  . Years of Education: N/A   Occupational History  . Not on file.   Social History Main Topics  . Smoking status: Never Smoker   . Smokeless tobacco: Not on file  . Alcohol Use: 0.5 - 1.0 oz/week    1-2 drink(s) per week  . Drug Use: No  . Sexual Activity: Yes    Partners: Female   Other Topics Concern  . Not on file   Social History Narrative  . No narrative on file     Objective: BP 137/95  Pulse 69  Wt 232 lb (105.235 kg)  General: Alert and Oriented, No Acute Distress HEENT: Pupils equal, round, reactive to light. Conjunctivae clear.  Moist mucous membranes pharynx unremarkable Lungs: Clear to auscultation bilaterally, no wheezing/ronchi/rales.  Comfortable work of breathing. Good air movement. Cardiac: Regular rate and rhythm. Normal S1/S2.  No murmurs, rubs, nor gallops.   Extremities: No peripheral edema.  Strong peripheral pulses.  Mental Status: No  depression, anxiety, nor agitation. Skin: Warm and dry. He has a vertical 1 cm scar representing a well-healed laceration on his forehead  Assessment & Plan: Tyler Moody was seen today for hypertension.  Diagnoses and associated orders for this visit:  Essential hypertension    Essential hypertension: I recommended to him that he start on a blood pressure lowering medication such as hydrochlorothiazide. He tells me he would prefer not to take any medications and would rather increase physical activity and focus hard on reducing sodium in his diet. We discussed the DASH diet and exercise ideas involving his treadmill.  Return in about 4 weeks (around 03/26/2014) for CPE.

## 2014-02-26 NOTE — Patient Instructions (Signed)
DASH Eating Plan °DASH stands for "Dietary Approaches to Stop Hypertension." The DASH eating plan is a healthy eating plan that has been shown to reduce high blood pressure (hypertension). Additional health benefits may include reducing the risk of type 2 diabetes mellitus, heart disease, and stroke. The DASH eating plan may also help with weight loss. °WHAT DO I NEED TO KNOW ABOUT THE DASH EATING PLAN? °For the DASH eating plan, you will follow these general guidelines: °· Choose foods with a percent daily value for sodium of less than 5% (as listed on the food label). °· Use salt-free seasonings or herbs instead of table salt or sea salt. °· Check with your health care provider or pharmacist before using salt substitutes. °· Eat lower-sodium products, often labeled as "lower sodium" or "no salt added." °· Eat fresh foods. °· Eat more vegetables, fruits, and low-fat dairy products. °· Choose whole grains. Look for the word "whole" as the first word in the ingredient list. °· Choose fish and skinless chicken or turkey more often than red meat. Limit fish, poultry, and meat to 6 oz (170 g) each day. °· Limit sweets, desserts, sugars, and sugary drinks. °· Choose heart-healthy fats. °· Limit cheese to 1 oz (28 g) per day. °· Eat more home-cooked food and less restaurant, buffet, and fast food. °· Limit fried foods. °· Cook foods using methods other than frying. °· Limit canned vegetables. If you do use them, rinse them well to decrease the sodium. °· When eating at a restaurant, ask that your food be prepared with less salt, or no salt if possible. °WHAT FOODS CAN I EAT? °Seek help from a dietitian for individual calorie needs. °Grains °Whole grain or whole wheat bread. Brown rice. Whole grain or whole wheat pasta. Quinoa, bulgur, and whole grain cereals. Low-sodium cereals. Corn or whole wheat flour tortillas. Whole grain cornbread. Whole grain crackers. Low-sodium crackers. °Vegetables °Fresh or frozen vegetables  (raw, steamed, roasted, or grilled). Low-sodium or reduced-sodium tomato and vegetable juices. Low-sodium or reduced-sodium tomato sauce and paste. Low-sodium or reduced-sodium canned vegetables.  °Fruits °All fresh, canned (in natural juice), or frozen fruits. °Meat and Other Protein Products °Ground beef (85% or leaner), grass-fed beef, or beef trimmed of fat. Skinless chicken or turkey. Ground chicken or turkey. Pork trimmed of fat. All fish and seafood. Eggs. Dried beans, peas, or lentils. Unsalted nuts and seeds. Unsalted canned beans. °Dairy °Low-fat dairy products, such as skim or 1% milk, 2% or reduced-fat cheeses, low-fat ricotta or cottage cheese, or plain low-fat yogurt. Low-sodium or reduced-sodium cheeses. °Fats and Oils °Tub margarines without trans fats. Light or reduced-fat mayonnaise and salad dressings (reduced sodium). Avocado. Safflower, olive, or canola oils. Natural peanut or almond butter. °Other °Unsalted popcorn and pretzels. °The items listed above may not be a complete list of recommended foods or beverages. Contact your dietitian for more options. °WHAT FOODS ARE NOT RECOMMENDED? °Grains °White bread. White pasta. White rice. Refined cornbread. Bagels and croissants. Crackers that contain trans fat. °Vegetables °Creamed or fried vegetables. Vegetables in a cheese sauce. Regular canned vegetables. Regular canned tomato sauce and paste. Regular tomato and vegetable juices. °Fruits °Dried fruits. Canned fruit in light or heavy syrup. Fruit juice. °Meat and Other Protein Products °Fatty cuts of meat. Ribs, chicken wings, bacon, sausage, bologna, salami, chitterlings, fatback, hot dogs, bratwurst, and packaged luncheon meats. Salted nuts and seeds. Canned beans with salt. °Dairy °Whole or 2% milk, cream, half-and-half, and cream cheese. Whole-fat or sweetened yogurt. Full-fat   cheeses or blue cheese. Nondairy creamers and whipped toppings. Processed cheese, cheese spreads, or cheese  curds. °Condiments °Onion and garlic salt, seasoned salt, table salt, and sea salt. Canned and packaged gravies. Worcestershire sauce. Tartar sauce. Barbecue sauce. Teriyaki sauce. Soy sauce, including reduced sodium. Steak sauce. Fish sauce. Oyster sauce. Cocktail sauce. Horseradish. Ketchup and mustard. Meat flavorings and tenderizers. Bouillon cubes. Hot sauce. Tabasco sauce. Marinades. Taco seasonings. Relishes. °Fats and Oils °Butter, stick margarine, lard, shortening, ghee, and bacon fat. Coconut, palm kernel, or palm oils. Regular salad dressings. °Other °Pickles and olives. Salted popcorn and pretzels. °The items listed above may not be a complete list of foods and beverages to avoid. Contact your dietitian for more information. °WHERE CAN I FIND MORE INFORMATION? °National Heart, Lung, and [employer] Institute: www.nhlbi.nih.gov/health/health-topics/topics/dash/ °Document Released: 06/07/2011 Document Revised: 11/02/2013 Document Reviewed: 04/22/2013 °ExitCare® Patient Information ©2015 [employer]. This information is not intended to replace advice given to you by your health care provider. Make sure you discuss any questions you have with your health care provider. ° °

## 2014-03-26 ENCOUNTER — Encounter: Payer: Self-pay | Admitting: Family Medicine

## 2014-03-26 ENCOUNTER — Ambulatory Visit (INDEPENDENT_AMBULATORY_CARE_PROVIDER_SITE_OTHER): Payer: 59 | Admitting: Family Medicine

## 2014-03-26 VITALS — BP 137/80 | HR 68 | Ht 71.0 in | Wt 227.0 lb

## 2014-03-26 DIAGNOSIS — N182 Chronic kidney disease, stage 2 (mild): Secondary | ICD-10-CM

## 2014-03-26 DIAGNOSIS — N289 Disorder of kidney and ureter, unspecified: Secondary | ICD-10-CM

## 2014-03-26 DIAGNOSIS — I10 Essential (primary) hypertension: Secondary | ICD-10-CM

## 2014-03-26 DIAGNOSIS — Z0000 Encounter for general adult medical examination without abnormal findings: Secondary | ICD-10-CM

## 2014-03-26 NOTE — Patient Instructions (Signed)
Dr. Cyprus Kuang's General Advice Following Your Complete Physical Exam  The Benefits of Regular Exercise: Unless you suffer from an uncontrolled cardiovascular condition, studies strongly suggest that regular exercise and physical activity will add to both the quality and length of your life.  The World Health Organization recommends 150 minutes of moderate intensity aerobic activity every week.  This is best split over 3-4 days a week, and can be as simple as a brisk walk for just over 35 minutes "most days of the week".  This type of exercise has been shown to lower LDL-Cholesterol, lower average blood sugars, lower blood pressure, lower cardiovascular disease risk, improve memory, and increase one's overall sense of wellbeing.  The addition of anaerobic (or "strength training") exercises offers additional benefits including but not limited to increased metabolism, prevention of osteoporosis, and improved overall cholesterol levels.  How Can I Strive For A Low-Fat Diet?: Current guidelines recommend that 25-35 percent of your daily energy (food) intake should come from fats.  One might ask how can this be achieved without having to dissect each meal on a daily basis?  Switch to skim or 1% milk instead of whole milk.  Focus on lean meats such as ground turkey, fresh fish, baked chicken, and lean cuts of beef as your source of dietary protein.  Limit saturated fat consumption to less than 10% of your daily caloric intake.  Limit trans fatty acid consumption primarily by limiting synthetic trans fats such as partially hydrogenated oils (Ex: fried fast foods).  Substitute olive or vegetable oil for solid fats where possible.  Moderation of Salt Intake: Provided you don't carry a diagnosis of congestive heart failure nor renal failure, I recommend a daily allowance of no more than 2300 mg of salt (sodium).  Keeping under this daily goal is associated with a decreased risk of cardiovascular events, creeping  above it can lead to elevated blood pressures and increases your risk of cardiovascular events.  Milligrams (mg) of salt is listed on all nutrition labels, and your daily intake can add up faster than you think.  Most canned and frozen dinners can pack in over half your daily salt allowance in one meal.    Lifestyle Health Risks: Certain lifestyle choices carry specific health risks.  As you may already know, tobacco use has been associated with increasing one's risk of cardiovascular disease, pulmonary disease, numerous cancers, among many other issues.  What you may not know is that there are medications and nicotine replacement strategies that can more than double your chances of successfully quitting.  I would be thrilled to help manage your quitting strategy if you currently use tobacco products.  When it comes to alcohol use, I've yet to find an "ideal" daily allowance.  Provided an individual does not have a medical condition that is exacerbated by alcohol consumption, general guidelines determine "safe drinking" as no more than two standard drinks for a man or no more than one standard drink for a male per day.  However, much debate still exists on whether any amount of alcohol consumption is technically "safe".  My general advice, keep alcohol consumption to a minimum for general health promotion.  If you or others believe that alcohol, tobacco, or recreational drug use is interfering with your life, I would be happy to provide confidential counseling regarding treatment options.  General "Over The Counter" Nutrition Advice: Postmenopausal women should aim for a daily calcium intake of 1200 mg, however a significant portion of this might already be   provided by diets including milk, yogurt, cheese, and other dairy products.  Vitamin D has been shown to help preserve bone density, prevent fatigue, and has even been shown to help reduce falls in the elderly.  Ensuring a daily intake of 800 Units of  Vitamin D is a good place to start to enjoy the above benefits, we can easily check your Vitamin D level to see if you'd potentially benefit from supplementation beyond 800 Units a day.  Folic Acid intake should be of particular concern to women of childbearing age.  Daily consumption of 400-800 mcg of Folic Acid is recommended to minimize the chance of spinal cord defects in a fetus should pregnancy occur.    For many adults, accidents still remain one of the most common culprits when it comes to cause of death.  Some of the simplest but most effective preventitive habits you can adopt include regular seatbelt use, proper helmet use, securing firearms, and regularly testing your smoke and carbon monoxide detectors.  Tyler Qin B. Ebone Alcivar DO [facility] [address], [address] [place], [place] [address] Phone: [phone]  

## 2014-03-26 NOTE — Progress Notes (Signed)
CC: Tyler Moody is a 43 y.o. male is here for Annual Exam   Subjective: HPI:  Colonoscopy: No family history of colon cancer, will begin screening age 58 Prostate: Discussed screening risks/beneifts with patient today, will begin screening around age 67  Influenza Vaccine: UTD Pneumovax: no current indication Td/Tdap: Tdap UTD Zoster: (Start 44 yo)  Here for CPE, rare alcohol use no tobacco nor recreational drug use.  He's lost some weight since we saw him last by using his treadmill 4 times a week week an hour at a time  Review of Systems - General ROS: negative for - chills, fever, night sweats, weight gain or weight loss Ophthalmic ROS: negative for - decreased vision Psychological ROS: negative for - anxiety or depression ENT ROS: negative for - hearing change, nasal congestion, tinnitus or allergies Hematological and Lymphatic ROS: negative for - bleeding problems, bruising or swollen lymph nodes Breast ROS: negative Respiratory ROS: no cough, shortness of breath, or wheezing Cardiovascular ROS: no chest pain or dyspnea on exertion Gastrointestinal ROS: no abdominal pain, change in bowel habits, or black or bloody stools Genito-Urinary ROS: negative for - genital discharge, genital ulcers, incontinence or abnormal bleeding from genitals Musculoskeletal ROS: negative for - joint pain or muscle pain Neurological ROS: negative for - headaches or memory loss Dermatological ROS: negative for lumps, mole changes, rash and skin lesion changes  Past Medical History  Diagnosis Date  . Heart murmur     Past Surgical History  Procedure Laterality Date  . Finger fracture surgery Left     thumb   Family History  Problem Relation Age of Onset  . Cancer Mother     breast CA  . Hypertension Father     History   Social History  . Marital Status: Married    Spouse Name: N/A    Number of Children: N/A  . Years of Education: N/A   Occupational History  . Not on file.    Social History Main Topics  . Smoking status: Never Smoker   . Smokeless tobacco: Not on file  . Alcohol Use: 0.5 - 1.0 oz/week    1-2 drink(s) per week  . Drug Use: No  . Sexual Activity: Yes    Partners: Female   Other Topics Concern  . Not on file   Social History Narrative  . No narrative on file     Objective: BP 137/80  Pulse 68  Ht 5\' 11"  (1.803 m)  Wt 227 lb (102.967 kg)  BMI 31.67 kg/m2  General: No Acute Distress HEENT: Atraumatic, normocephalic, conjunctivae normal without scleral icterus.  No nasal discharge, hearing grossly intact, TMs with good landmarks bilaterally with no middle ear abnormalities, posterior pharynx clear without oral lesions. Neck: Supple, trachea midline, no cervical nor supraclavicular adenopathy. Pulmonary: Clear to auscultation bilaterally without wheezing, rhonchi, nor rales. Cardiac: Regular rate and rhythm.  No murmurs, rubs, nor gallops. No peripheral edema.  2+ peripheral pulses bilaterally. Abdomen: Bowel sounds normal.  No masses.  Non-tender without rebound.  Negative Murphy's sign. GU: Bilateral descended testes no inguinal hernia MSK: Grossly intact, no signs of weakness.  Full strength throughout upper and lower extremities.  Full ROM in upper and lower extremities.  No midline spinal tenderness. Neuro: Gait unremarkable, CN II-XII grossly intact.  C5-C6 Reflex 2/4 Bilaterally, L4 Reflex 2/4 Bilaterally.  Cerebellar function intact. Skin: No rashes. Psych: Alert and oriented to person/place/time.  Thought process normal. No anxiety/depression.  Assessment & Plan: Tyler Moody was seen today  for annual exam.  Diagnoses and associated orders for this visit:  Annual physical exam - Lipid panel - COMPLETE METABOLIC PANEL WITH GFR - CBC  Chronic renal insufficiency, stage II (mild)  Essential hypertension    Healthy lifestyle interventions including but not limited to regular exercise, a healthy low fat diet, moderation of  salt intake, the dangers of tobacco/alcohol/recreational drug use, nutrition supplementation, and accident avoidance were discussed with the patient and a handout was provided for future reference.  Applauded his weight loss and reduction of blood pressure with exercise  Return in about 3 months (around 06/25/2014) for Blood pressure.

## 2014-04-13 LAB — COMPLETE METABOLIC PANEL WITH GFR
ALT: 50 U/L (ref 0–53)
AST: 28 U/L (ref 0–37)
Albumin: 4.3 g/dL (ref 3.5–5.2)
Alkaline Phosphatase: 65 U/L (ref 39–117)
BUN: 15 mg/dL (ref 6–23)
CALCIUM: 9.6 mg/dL (ref 8.4–10.5)
CHLORIDE: 104 meq/L (ref 96–112)
CO2: 27 mEq/L (ref 19–32)
CREATININE: 1.06 mg/dL (ref 0.50–1.35)
GFR, Est African American: >89 mL/min
GFR, Est Non African American: 85 mL/min
Glucose, Bld: 94 mg/dL (ref 70–99)
POTASSIUM: 4.2 meq/L (ref 3.5–5.3)
Sodium: 139 mEq/L (ref 135–145)
Total Bilirubin: 0.7 mg/dL (ref 0.2–1.2)
Total Protein: 6.5 g/dL (ref 6.0–8.3)

## 2014-04-13 LAB — CBC
HEMATOCRIT: 46.9 % (ref 39.0–52.0)
Hemoglobin: 15.8 g/dL (ref 13.0–17.0)
MCH: 30.7 pg (ref 26.0–34.0)
MCHC: 33.7 g/dL (ref 30.0–36.0)
MCV: 91.1 fL (ref 78.0–100.0)
PLATELETS: 310 10*3/uL (ref 150–400)
RBC: 5.15 MIL/uL (ref 4.22–5.81)
RDW: 13.3 % (ref 11.5–15.5)
WBC: 7.3 10*3/uL (ref 4.0–10.5)

## 2014-04-13 LAB — LIPID PANEL
Cholesterol: 114 mg/dL (ref 0–200)
HDL: 37 mg/dL — ABNORMAL LOW (ref >39)
LDL CALC: 50 mg/dL (ref 0–99)
TRIGLYCERIDES: 135 mg/dL (ref <150)
Total CHOL/HDL Ratio: 3.1 Ratio
VLDL: 27 mg/dL (ref 0–40)

## 2014-06-01 ENCOUNTER — Ambulatory Visit (INDEPENDENT_AMBULATORY_CARE_PROVIDER_SITE_OTHER): Payer: 59

## 2014-06-01 ENCOUNTER — Encounter: Payer: Self-pay | Admitting: Family Medicine

## 2014-06-01 ENCOUNTER — Ambulatory Visit (INDEPENDENT_AMBULATORY_CARE_PROVIDER_SITE_OTHER): Payer: 59 | Admitting: Family Medicine

## 2014-06-01 VITALS — BP 132/90 | HR 66 | Temp 98.2°F | Ht 71.0 in | Wt 229.0 lb

## 2014-06-01 DIAGNOSIS — H109 Unspecified conjunctivitis: Secondary | ICD-10-CM

## 2014-06-01 DIAGNOSIS — B9689 Other specified bacterial agents as the cause of diseases classified elsewhere: Secondary | ICD-10-CM

## 2014-06-01 DIAGNOSIS — R220 Localized swelling, mass and lump, head: Secondary | ICD-10-CM

## 2014-06-01 DIAGNOSIS — L03211 Cellulitis of face: Secondary | ICD-10-CM

## 2014-06-01 DIAGNOSIS — H00033 Abscess of eyelid right eye, unspecified eyelid: Secondary | ICD-10-CM

## 2014-06-01 MED ORDER — POLYMYXIN B-TRIMETHOPRIM 10000-0.1 UNIT/ML-% OP SOLN: 2.0000 [drp] | OPHTHALMIC | Status: DC

## 2014-06-01 MED ORDER — CLINDAMYCIN HCL 300 MG PO CAPS: 300.0000 mg | ORAL_CAPSULE | Freq: Three times a day (TID) | ORAL | Status: DC

## 2014-06-01 MED ORDER — IOHEXOL 300 MG/ML  SOLN
75.0000 mL | Freq: Once | INTRAMUSCULAR | Status: AC | PRN
  Administered 2014-06-01: 75 mL via INTRAVENOUS

## 2014-06-01 NOTE — Progress Notes (Signed)
CC: Tyler Moody is a 43 y.o. male is here for Eye Problem   Subjective: HPI:  Complains of eye irritation, itching, redness that has been present since Thursday of last week. Symptoms seem to be worsening since onset. Over the past 2 days he's had increased mucus production from the right eye. He believes it's mostly involving the right eye to moderate degree but now possibly beginning to a trace degree in the left eye. No interventions as of yet. He denies any known exposure to foreign bodies that could have come near the eye. He denies photophobia, pain with movement of the eye but does have discomfort with blinking. Denies vision loss. Denies headache, fevers, chills, nor nasal congestion.  Review Of Systems Outlined In HPI  Past Medical History  Diagnosis Date  . Heart murmur     Past Surgical History  Procedure Laterality Date  . Finger fracture surgery Left     thumb   Family History  Problem Relation Age of Onset  . Cancer Mother     breast CA  . Hypertension Father     History   Social History  . Marital Status: Married    Spouse Name: N/A    Number of Children: N/A  . Years of Education: N/A   Occupational History  . Not on file.   Social History Main Topics  . Smoking status: Never Smoker   . Smokeless tobacco: Not on file  . Alcohol Use: 0.5 - 1.0 oz/week    1-2 drink(s) per week  . Drug Use: No  . Sexual Activity:    Partners: Female   Other Topics Concern  . Not on file   Social History Narrative     Objective: BP 132/90 mmHg  Pulse 66  Temp(Src) 98.2 F (36.8 C) (Oral)  Ht 5\' 11"  (1.803 m)  Wt 229 lb (103.874 kg)  BMI 31.95 kg/m2  General: Alert and Oriented, No Acute Distress HEENT: Pupils equal, round, reactive to light. Left Conjunctivae clear. Right conjunctiva has severe erythema peripherally and moderate near the limbus. Increased tear production in the right eye. There is mild to moderate erythema and swelling of the lower eyelid and  skin overlying the maxilla on the right. under [employer] with floor since staining there is no abnormality seen on the surface of the right cornea. Anterior chamber of the right eye and left eye appear open without debris.Moist mucous membranes, pharynx without inflammation nor lesions.   Extremities: No peripheral edema.  Strong peripheral pulses.  Mental Status: No depression, anxiety, nor agitation. Skin: Warm and dry.  Assessment & Plan: Tyler Moody was seen today for eye problem.  Diagnoses and associated orders for this visit:  Conjunctivitis of right eye - CT Maxillofacial W/Cm; Future - trimethoprim-polymyxin b (POLYTRIM) ophthalmic solution; Place 2 drops into both eyes every 4 (four) hours. For ten days.  Facial swelling - CT Maxillofacial W/Cm; Future - trimethoprim-polymyxin b (POLYTRIM) ophthalmic solution; Place 2 drops into both eyes every 4 (four) hours. For ten days.  Preseptal cellulitis, right - clindamycin (CLEOCIN) 300 MG capsule; Take 1 capsule (300 mg total) by mouth 3 (three) times daily.    There was concern that the patient could have orbital cellulitis therefore CT scan with contrast obtained. Fortunately there is no evidence of this but not surprisingly there is evidence of preseptal cellulitis therefore start clindamycin and ophthalmic Polytrim.   Return if symptoms worsen or fail to improve.

## 2014-10-06 ENCOUNTER — Telehealth: Payer: Self-pay | Admitting: Family Medicine

## 2014-10-11 NOTE — Telephone Encounter (Signed)
Close  

## 2015-03-28 ENCOUNTER — Ambulatory Visit (INDEPENDENT_AMBULATORY_CARE_PROVIDER_SITE_OTHER): Payer: 59 | Admitting: Family Medicine

## 2015-03-28 ENCOUNTER — Encounter: Payer: Self-pay | Admitting: Family Medicine

## 2015-03-28 VITALS — BP 122/74 | HR 50 | Ht 71.0 in | Wt 206.0 lb

## 2015-03-28 DIAGNOSIS — Z0000 Encounter for general adult medical examination without abnormal findings: Secondary | ICD-10-CM | POA: Diagnosis not present

## 2015-03-28 LAB — LIPID PANEL
Cholesterol: 101 mg/dL — ABNORMAL LOW (ref 125–200)
HDL: 47 mg/dL (ref >=40)
LDL CALC: 45 mg/dL (ref <130)
Total CHOL/HDL Ratio: 2.1 Ratio (ref <=5.0)
Triglycerides: 43 mg/dL (ref <150)
VLDL: 9 mg/dL (ref <30)

## 2015-03-28 LAB — COMPLETE METABOLIC PANEL WITH GFR
ALBUMIN: 4.4 g/dL (ref 3.6–5.1)
ALK PHOS: 51 U/L (ref 40–115)
ALT: 18 U/L (ref 9–46)
AST: 18 U/L (ref 10–40)
BILIRUBIN TOTAL: 0.9 mg/dL (ref 0.2–1.2)
BUN: 19 mg/dL (ref 7–25)
CO2: 28 mmol/L (ref 20–31)
CREATININE: 0.93 mg/dL (ref 0.60–1.35)
Calcium: 9.7 mg/dL (ref 8.6–10.3)
Chloride: 103 mmol/L (ref 98–110)
GFR, Est African American: >89 mL/min (ref >=60)
GFR, Est Non African American: >89 mL/min (ref >=60)
GLUCOSE: 99 mg/dL (ref 65–99)
Potassium: 4.5 mmol/L (ref 3.5–5.3)
SODIUM: 139 mmol/L (ref 135–146)
Total Protein: 6.4 g/dL (ref 6.1–8.1)

## 2015-03-28 LAB — CBC
HEMATOCRIT: 44.6 % (ref 39.0–52.0)
HEMOGLOBIN: 15.6 g/dL (ref 13.0–17.0)
MCH: 31.1 pg (ref 26.0–34.0)
MCHC: 35 g/dL (ref 30.0–36.0)
MCV: 88.8 fL (ref 78.0–100.0)
MPV: 8.8 fL (ref 8.6–12.4)
Platelets: 315 10*3/uL (ref 150–400)
RBC: 5.02 MIL/uL (ref 4.22–5.81)
RDW: 13.5 % (ref 11.5–15.5)
WBC: 6.2 10*3/uL (ref 4.0–10.5)

## 2015-03-28 NOTE — Progress Notes (Signed)
CC: Tyler Moody is a 44 y.o. male is here for Annual Exam   Subjective: HPI:  No family history of colon cancer or prostate cancer will consider screening at age 44   Influenza Vaccine: Already received this season Pneumovax: no current indication Td/Tdap: UTD from 2015 Zoster: (Start 45 yo)  Requesting complete physical exam with no acute complaints. He's been able to lose weight with diet and exercise.  Review of Systems - General ROS: negative for - chills, fever, night sweats, weight gain or weight loss Ophthalmic ROS: negative for - decreased vision Psychological ROS: negative for - anxiety or depression ENT ROS: negative for - hearing change, nasal congestion, tinnitus or allergies Hematological and Lymphatic ROS: negative for - bleeding problems, bruising or swollen lymph nodes Breast ROS: negative Respiratory ROS: no cough, shortness of breath, or wheezing Cardiovascular ROS: no chest pain or dyspnea on exertion Gastrointestinal ROS: no abdominal pain, change in bowel habits, or black or bloody stools Genito-Urinary ROS: negative for - genital discharge, genital ulcers, incontinence or abnormal bleeding from genitals Musculoskeletal ROS: negative for - joint pain or muscle pain Neurological ROS: negative for - headaches or memory loss Dermatological ROS: negative for lumps, mole changes, rash and skin lesion changes  Past Medical History  Diagnosis Date  . Heart murmur     Past Surgical History  Procedure Laterality Date  . Finger fracture surgery Left     thumb   Family History  Problem Relation Age of Onset  . Cancer Mother     breast CA  . Hypertension Father     Social History   Social History  . Marital Status: Married    Spouse Name: N/A  . Number of Children: N/A  . Years of Education: N/A   Occupational History  . Not on file.   Social History Main Topics  . Smoking status: Never Smoker   . Smokeless tobacco: Not on file  . Alcohol Use:  0.5 - 1.0 oz/week    1-2 drink(s) per week  . Drug Use: No  . Sexual Activity:    Partners: Female   Other Topics Concern  . Not on file   Social History Narrative     Objective: BP 122/74 mmHg  Pulse 50  Ht 5\' 11"  (1.803 m)  Wt 206 lb (93.441 kg)  BMI 28.74 kg/m2  General: No Acute Distress HEENT: Atraumatic, normocephalic, conjunctivae normal without scleral icterus.  No nasal discharge, hearing grossly intact, TMs with good landmarks bilaterally with no middle ear abnormalities, posterior pharynx clear without oral lesions. Neck: Supple, trachea midline, no cervical nor supraclavicular adenopathy. Pulmonary: Clear to auscultation bilaterally without wheezing, rhonchi, nor rales. Cardiac: Regular rate and rhythm.  No murmurs, rubs, nor gallops. No peripheral edema.  2+ peripheral pulses bilaterally. Abdomen: Bowel sounds normal.  No masses.  Non-tender without rebound.  Negative Murphy's sign. MSK: Grossly intact, no signs of weakness.  Full strength throughout upper and lower extremities.  Full ROM in upper and lower extremities.  No midline spinal tenderness. Neuro: Gait unremarkable, CN II-XII grossly intact.  C5-C6 Reflex 2/4 Bilaterally, L4 Reflex 2/4 Bilaterally.  Cerebellar function intact. Skin: No rashes. Psych: Alert and oriented to person/place/time.  Thought process normal. No anxiety/depression.  Assessment & Plan: Tyler Moody was seen today for annual exam.  Diagnoses and all orders for this visit:  Annual physical exam -     COMPLETE METABOLIC PANEL WITH GFR -     Lipid panel -  CBC   Healthy lifestyle interventions including but not limited to regular exercise, a healthy low fat diet, moderation of salt intake, the dangers of tobacco/alcohol/recreational drug use, nutrition supplementation, and accident avoidance were discussed with the patient and a handout was provided for future reference.  Return in about 6 months (around 09/25/2015) for BP and  weight.Marland Kitchen

## 2015-03-28 NOTE — Patient Instructions (Signed)
Dr. Hommel's General Advice Following Your Complete Physical Exam  The Benefits of Regular Exercise: Unless you suffer from an uncontrolled cardiovascular condition, studies strongly suggest that regular exercise and physical activity will add to both the quality and length of your life.  The World Health Organization recommends 150 minutes of moderate intensity aerobic activity every week.  This is best split over 3-4 days a week, and can be as simple as a brisk walk for just over 35 minutes "most days of the week".  This type of exercise has been shown to lower LDL-Cholesterol, lower average blood sugars, lower blood pressure, lower cardiovascular disease risk, improve memory, and increase one's overall sense of wellbeing.  The addition of anaerobic (or "strength training") exercises offers additional benefits including but not limited to increased metabolism, prevention of osteoporosis, and improved overall cholesterol levels.  How Can I Strive For A Low-Fat Diet?: Current guidelines recommend that 25-35 percent of your daily energy (food) intake should come from fats.  One might ask how can this be achieved without having to dissect each meal on a daily basis?  Switch to skim or 1% milk instead of whole milk.  Focus on lean meats such as ground turkey, fresh fish, baked chicken, and lean cuts of beef as your source of dietary protein.  Limit saturated fat consumption to less than 10% of your daily caloric intake.  Limit trans fatty acid consumption primarily by limiting synthetic trans fats such as partially hydrogenated oils (Ex: fried fast foods).  Substitute olive or vegetable oil for solid fats where possible.  Moderation of Salt Intake: Provided you don't carry a diagnosis of congestive heart failure nor renal failure, I recommend a daily allowance of no more than 2300 mg of salt (sodium).  Keeping under this daily goal is associated with a decreased risk of cardiovascular events, creeping  above it can lead to elevated blood pressures and increases your risk of cardiovascular events.  Milligrams (mg) of salt is listed on all nutrition labels, and your daily intake can add up faster than you think.  Most canned and frozen dinners can pack in over half your daily salt allowance in one meal.    Lifestyle Health Risks: Certain lifestyle choices carry specific health risks.  As you may already know, tobacco use has been associated with increasing one's risk of cardiovascular disease, pulmonary disease, numerous cancers, among many other issues.  What you may not know is that there are medications and nicotine replacement strategies that can more than double your chances of successfully quitting.  I would be thrilled to help manage your quitting strategy if you currently use tobacco products.  When it comes to alcohol use, I've yet to find an "ideal" daily allowance.  Provided an individual does not have a medical condition that is exacerbated by alcohol consumption, general guidelines determine "safe drinking" as no more than two standard drinks for a man or no more than one standard drink for a male per day.  However, much debate still exists on whether any amount of alcohol consumption is technically "safe".  My general advice, keep alcohol consumption to a minimum for general health promotion.  If you or others believe that alcohol, tobacco, or recreational drug use is interfering with your life, I would be happy to provide confidential counseling regarding treatment options.  General "Over The Counter" Nutrition Advice: Postmenopausal women should aim for a daily calcium intake of 1200 mg, however a significant portion of this might already be   provided by diets including milk, yogurt, cheese, and other dairy products.  Vitamin D has been shown to help preserve bone density, prevent fatigue, and has even been shown to help reduce falls in the elderly.  Ensuring a daily intake of 800 Units of  Vitamin D is a good place to start to enjoy the above benefits, we can easily check your Vitamin D level to see if you'd potentially benefit from supplementation beyond 800 Units a day.  Folic Acid intake should be of particular concern to women of childbearing age.  Daily consumption of 400-800 mcg of Folic Acid is recommended to minimize the chance of spinal cord defects in a fetus should pregnancy occur.    For many adults, accidents still remain one of the most common culprits when it comes to cause of death.  Some of the simplest but most effective preventitive habits you can adopt include regular seatbelt use, proper helmet use, securing firearms, and regularly testing your smoke and carbon monoxide detectors.  Sean B. Hommel DO [facility] [address], [address] [place], [place] [address] Phone: [phone]  

## 2015-05-07 IMAGING — CT CT MAXILLOFACIAL W/ CM
2 of 6 series · 15 of 37 positions shown, 19 images · IV contrast (omnipaque)
Comparison: None.

CLINICAL DATA: Right high itching for 5 days. Increasing redness
and drainage today. Initial encounter

EXAM:
CT MAXILLOFACIAL WITH CONTRAST
TECHNIQUE: Multidetector CT imaging of the maxillofacial structures was
performed with intravenous contrast. Multiplanar CT image
reconstructions were also generated. A small metallic BB was placed
on the right temple in order to reliably differentiate right from
left.
CONTRAST:  75mL OMNIPAQUE IOHEXOL 300 MG/ML  SOLN

[Series 2: max soft · axial · 0.35mm/px · z∈[-216,-191]mm · 2 of 67 slices shown]
[im 5/67  brain]
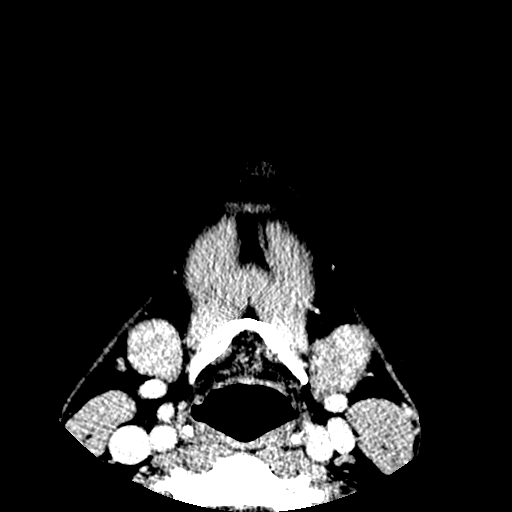
[im 15/67  brain]
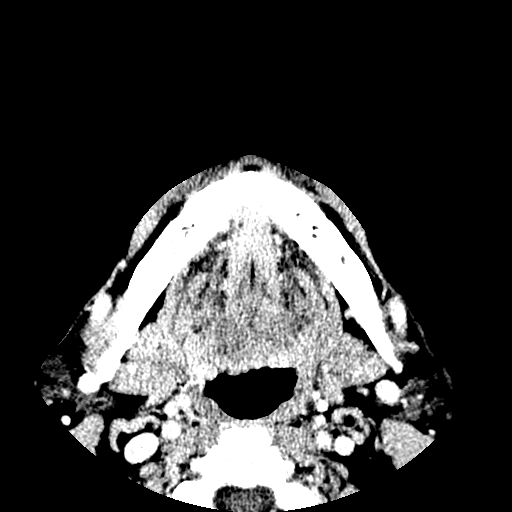

[Series 3: max bone · axial · 0.35mm/px · z∈[-216,-73]mm · 13 of 67 slices shown, 17 images]
[im 5/67  brain]
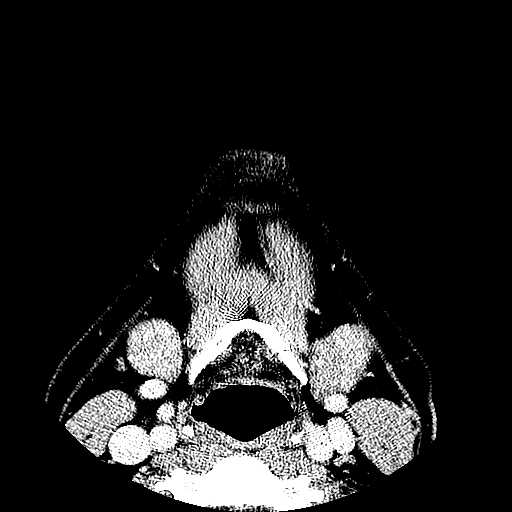
[im 5/67  bone]
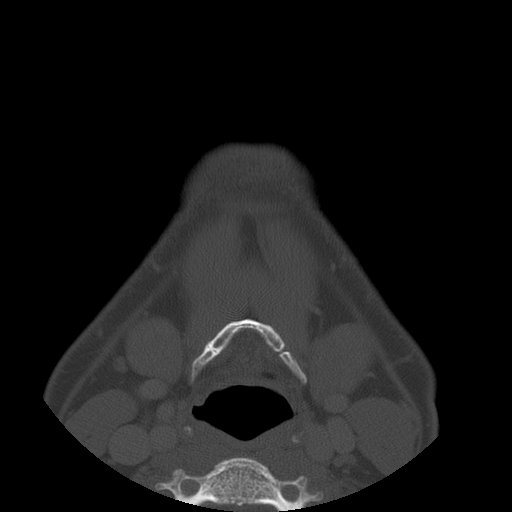
[im 10/67  bone]
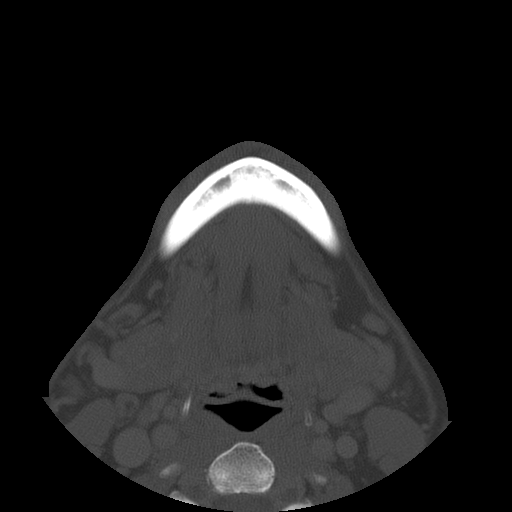
[im 15/67  bone]
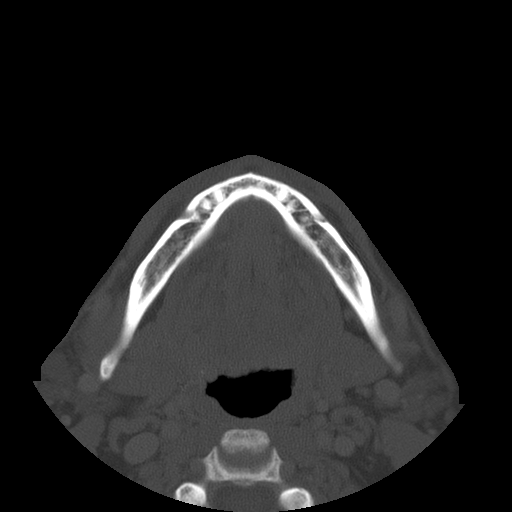
[im 19/67  bone]
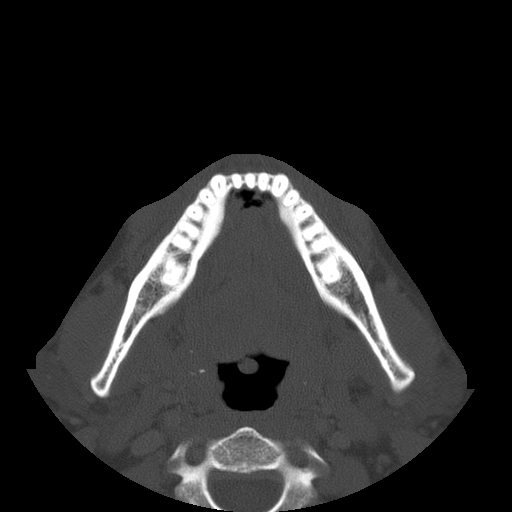
[im 24/67  brain]
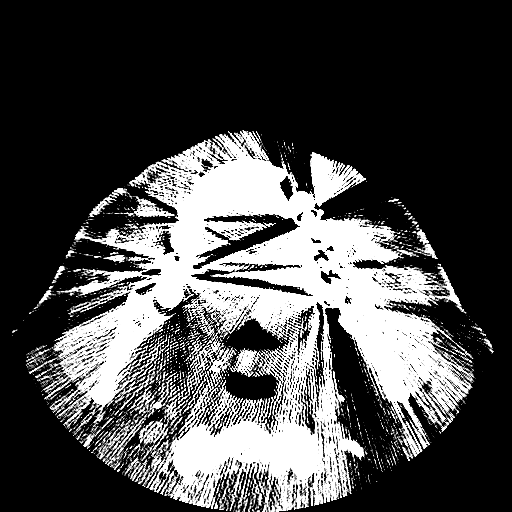
[im 24/67  bone]
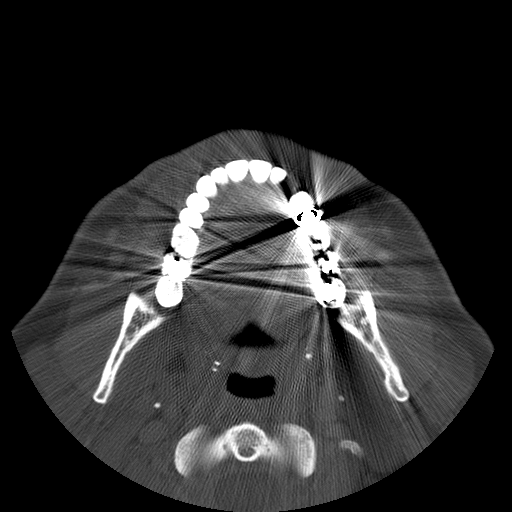
[im 29/67  bone]
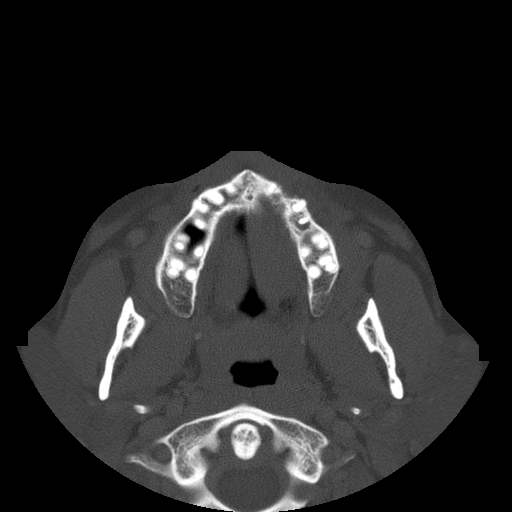
[im 34/67  bone]
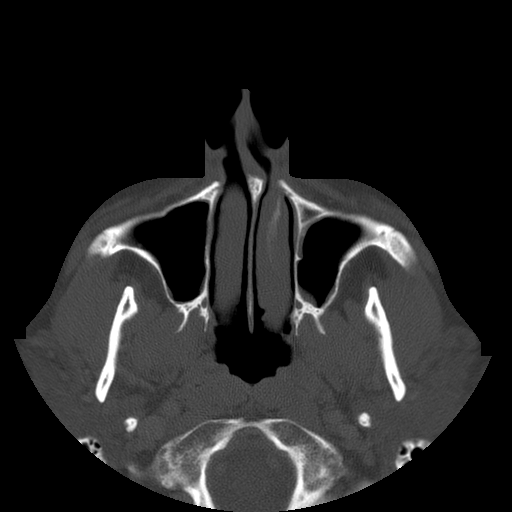
[im 38/67  bone]
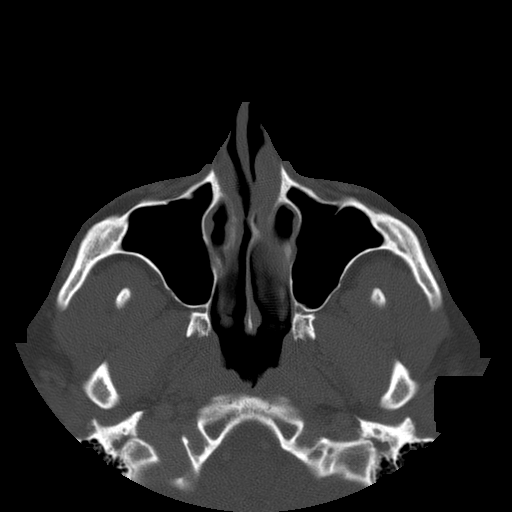
[im 43/67  brain]
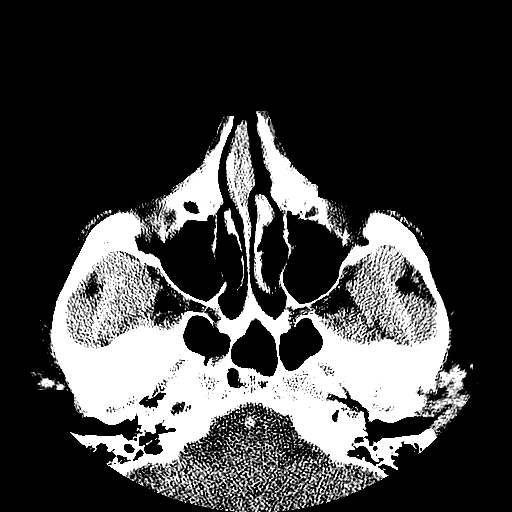
[im 43/67  bone]
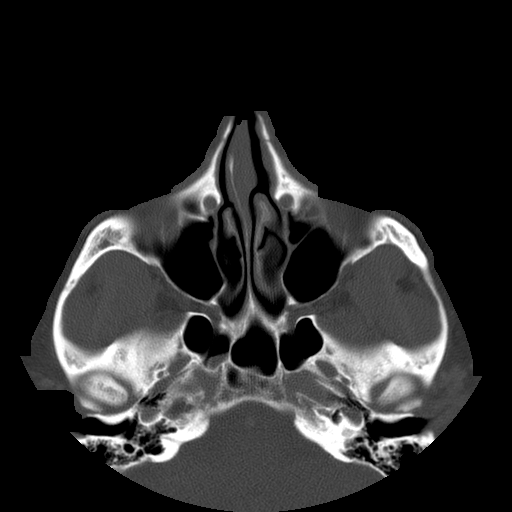
[im 48/67  bone]
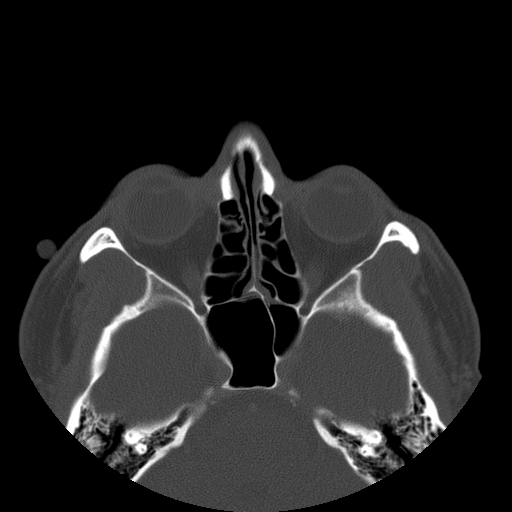
[im 52/67  bone]
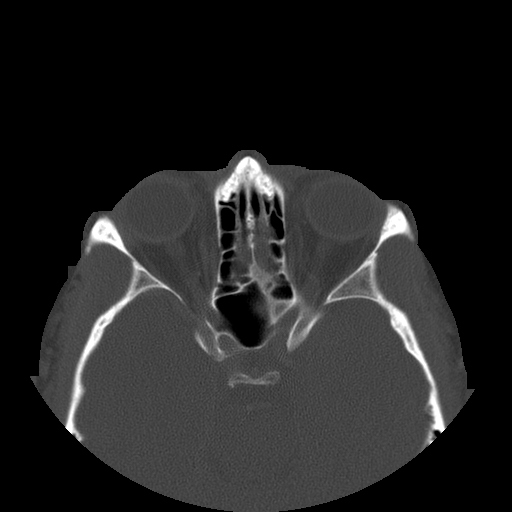
[im 57/67  bone]
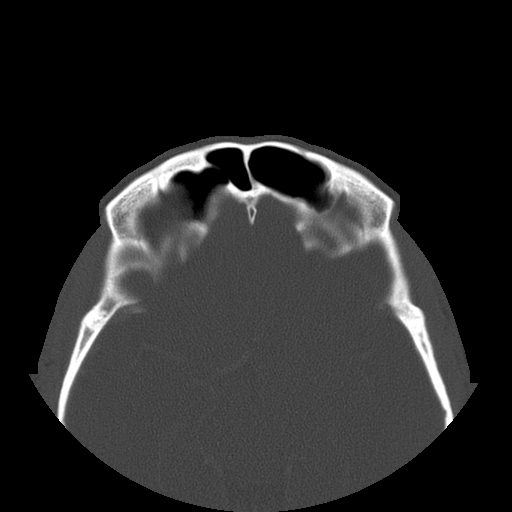
[im 62/67  brain]
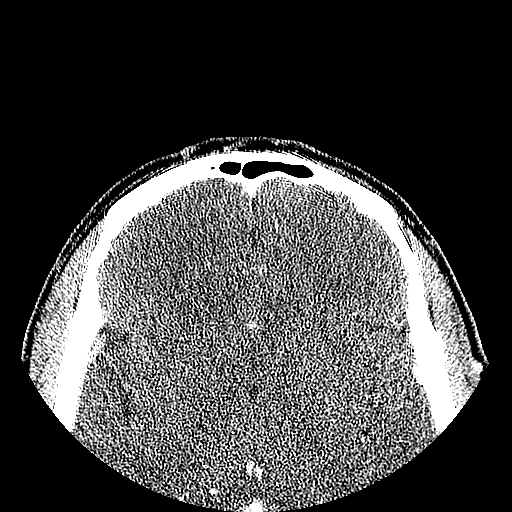
[im 62/67  bone]
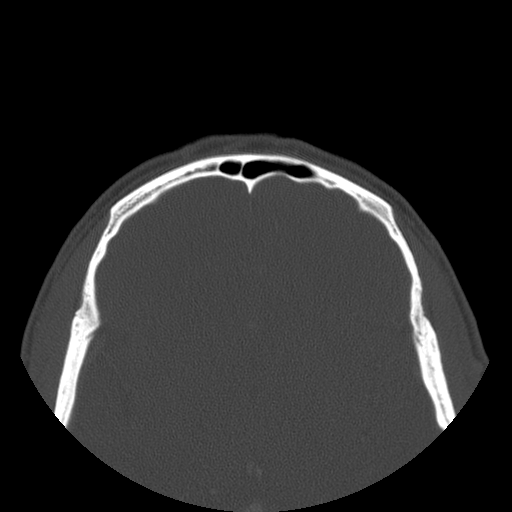

[15 of 37 positions shown; findings below may reference images not displayed]

FINDINGS: There is slightly increased stranding within the overlying soft
tissues of the right orbit and right side of the nose. No evidence
of abscess. Soft tissue structures within the right orbit are within
normal limits without evidence of thickening. No evidence of orbital
fatty stranding. No evidence of abscess within the right orbit. No
evidence of exophthalmos. Right globe is intact.

Minimally mucosal thickening in the bilateral maxillary sinuses and
sphenoid sinus. Frontal sinuses and ethmoid air cells are clear.

There is periapical lucency surrounding the root and implant of a
left upper bicuspid. Periapical abscess is not excluded. The
adjacent cuspid is absent.
IMPRESSION: Findings are compatible with mild preseptal cellulitis on the right.
No evidence of intra orbital cellulitis or abscess.

Periodontal disease as described.

## 2015-09-26 ENCOUNTER — Encounter: Payer: Self-pay | Admitting: Family Medicine

## 2015-09-26 ENCOUNTER — Ambulatory Visit (INDEPENDENT_AMBULATORY_CARE_PROVIDER_SITE_OTHER): Payer: 59 | Admitting: Family Medicine

## 2015-09-26 VITALS — BP 124/82 | HR 62 | Wt 208.0 lb

## 2015-09-26 DIAGNOSIS — I10 Essential (primary) hypertension: Secondary | ICD-10-CM | POA: Diagnosis not present

## 2015-09-26 NOTE — Progress Notes (Signed)
CC: Tyler Moody is a 45 y.o. male is here for Hypertension   Subjective: HPI:  Exercising 4 times a week. Trying to watch what he eats, trying to focus on a low-salt diet. No outside blood pressures report. No chest pain shortness of breath orthopnea or peripheral edema.   Review Of Systems Outlined In HPI  Past Medical History  Diagnosis Date  . Heart murmur     Past Surgical History  Procedure Laterality Date  . Finger fracture surgery Left     thumb   Family History  Problem Relation Age of Onset  . Cancer Mother     breast CA  . Hypertension Father     Social History   Social History  . Marital Status: Married    Spouse Name: N/A  . Number of Children: N/A  . Years of Education: N/A   Occupational History  . Not on file.   Social History Main Topics  . Smoking status: Never Smoker   . Smokeless tobacco: Not on file  . Alcohol Use: 0.5 - 1.0 oz/week    1-2 drink(s) per week  . Drug Use: No  . Sexual Activity:    Partners: Female   Other Topics Concern  . Not on file   Social History Narrative     Objective: BP 124/82 mmHg  Pulse 62  Wt 208 lb (94.348 kg)  General: Alert and Oriented, No Acute Distress HEENT: Pupils equal, round, reactive to light. Conjunctivae clear.   Lungs: Clear to auscultation bilaterally, no wheezing/ronchi/rales.  Comfortable work of breathing. Good air movement. Cardiac: Regular rate and rhythm. Normal S1/S2.  No murmurs, rubs, nor gallops.   Extremities: No peripheral edema.  Strong peripheral pulses.  Mental Status: No depression, anxiety, nor agitation. Skin: Warm and dry.  Assessment & Plan: Armel was seen today for hypertension.  Diagnoses and all orders for this visit:  Essential hypertension   Essential hypertension: After resting in the room and talking for a few minutes blood pressure was rechecked and was in the normotensive range. I encouraged him to keep up the good work and continue exercising most  days of the week. No need to consider blood pressure medication today.  Return in about 6 months (around 03/28/2016) for Blood Pressure.

## 2016-03-26 ENCOUNTER — Encounter: Payer: Self-pay | Admitting: Family Medicine

## 2016-03-28 ENCOUNTER — Ambulatory Visit (INDEPENDENT_AMBULATORY_CARE_PROVIDER_SITE_OTHER): Payer: 59 | Admitting: Family Medicine

## 2016-03-28 VITALS — BP 120/80 | HR 57 | Wt 211.0 lb

## 2016-03-28 DIAGNOSIS — Z114 Encounter for screening for human immunodeficiency virus [HIV]: Secondary | ICD-10-CM | POA: Diagnosis not present

## 2016-03-28 DIAGNOSIS — Z0000 Encounter for general adult medical examination without abnormal findings: Secondary | ICD-10-CM | POA: Diagnosis not present

## 2016-03-28 DIAGNOSIS — IMO0001 Reserved for inherently not codable concepts without codable children: Secondary | ICD-10-CM

## 2016-03-28 LAB — COMPLETE METABOLIC PANEL WITH GFR
ALT: 21 U/L (ref 9–46)
AST: 19 U/L (ref 10–40)
Albumin: 4.2 g/dL (ref 3.6–5.1)
Alkaline Phosphatase: 53 U/L (ref 40–115)
BUN: 15 mg/dL (ref 7–25)
CALCIUM: 9.5 mg/dL (ref 8.6–10.3)
CO2: 29 mmol/L (ref 20–31)
Chloride: 102 mmol/L (ref 98–110)
Creat: 1.03 mg/dL (ref 0.60–1.35)
GFR, Est African American: >89 mL/min (ref >=60)
GFR, Est Non African American: 87 mL/min (ref >=60)
Glucose, Bld: 90 mg/dL (ref 65–99)
POTASSIUM: 4.6 mmol/L (ref 3.5–5.3)
Sodium: 139 mmol/L (ref 135–146)
Total Bilirubin: 0.8 mg/dL (ref 0.2–1.2)
Total Protein: 6.6 g/dL (ref 6.1–8.1)

## 2016-03-28 LAB — LIPID PANEL
Cholesterol: 136 mg/dL (ref 125–200)
HDL: 42 mg/dL (ref >=40)
LDL Cholesterol: 81 mg/dL (ref <130)
Total CHOL/HDL Ratio: 3.2 Ratio (ref <=5.0)
Triglycerides: 66 mg/dL (ref <150)
VLDL: 13 mg/dL (ref <30)

## 2016-03-28 LAB — CBC
HEMATOCRIT: 46.7 % (ref 38.5–50.0)
Hemoglobin: 16.1 g/dL (ref 13.2–17.1)
MCH: 31 pg (ref 27.0–33.0)
MCHC: 34.5 g/dL (ref 32.0–36.0)
MCV: 89.8 fL (ref 80.0–100.0)
MPV: 9 fL (ref 7.5–12.5)
PLATELETS: 312 10*3/uL (ref 140–400)
RBC: 5.2 MIL/uL (ref 4.20–5.80)
RDW: 12.6 % (ref 11.0–15.0)
WBC: 7.2 10*3/uL (ref 3.8–10.8)

## 2016-03-28 NOTE — Patient Instructions (Addendum)
Thank you for coming in today. Return in 1 year or sooner if needed. Keep exercising and stay healthy.

## 2016-03-28 NOTE — Progress Notes (Signed)
       Tyler Moody is a 45 y.o. male who presents to [place]: [place] today for well adult visit. Patient is doing well with no active medical problems. He has a history of elevated blood pressure. However he is using exercise and diet as an effort to control his blood pressure. He denies any fevers chills nausea vomiting diarrhea chest pains palpitations shortness of breath. He does not take any medications regularly and feels well otherwise.   Past Medical History:  Diagnosis Date  . Heart murmur    Past Surgical History:  Procedure Laterality Date  . FINGER FRACTURE SURGERY Left    thumb   Social History  Substance Use Topics  . Smoking status: Never Smoker  . Smokeless tobacco: Not on file  . Alcohol use 0.5 - 1.0 oz/week    1 - 2 drink(s) per week   family history includes Cancer in his mother; Hypertension in his father.  ROS as above:  Medications: No current outpatient prescriptions on file.   No current facility-administered medications for this visit.    No Known Allergies   Exam:  BP 120/80   Pulse (!) 57   Wt 211 lb (95.7 kg)   BMI 29.43 kg/m  Gen: Well NAD HEENT: EOMI,  MMM Lungs: Normal work of breathing. CTABL Heart: RRR no MRG Abd: NABS, Soft. Nondistended, Nontender Exts: Brisk capillary refill, warm and well perfused.   No results found for this or any previous visit (from the past 24 hour(s)). No results found.    Assessment and Plan: 45 y.o. male with Well adult. Blood pressure is well controlled. Plan to obtain basic fasting labs. Vaccines up-to-date. Return in the near future.   Orders Placed This Encounter  Procedures  . CBC  . COMPLETE METABOLIC PANEL WITH GFR  . VITAMIN D 25 Hydroxy (Vit-D Deficiency, Fractures)  . Lipid panel  . HIV antibody    Discussed warning signs or symptoms. Please see discharge instructions.  Patient expresses understanding.

## 2016-03-29 LAB — HIV ANTIBODY (ROUTINE TESTING W REFLEX): HIV: NONREACTIVE

## 2016-03-29 LAB — VITAMIN D 25 HYDROXY (VIT D DEFICIENCY, FRACTURES): VIT D 25 HYDROXY: 43 ng/mL (ref 30–100)

## 2017-03-28 ENCOUNTER — Encounter: Payer: 59 | Admitting: Family Medicine

## 2017-04-02 ENCOUNTER — Ambulatory Visit (INDEPENDENT_AMBULATORY_CARE_PROVIDER_SITE_OTHER): Payer: 59 | Admitting: Family Medicine

## 2017-04-02 VITALS — BP 134/89 | HR 64 | Wt 224.0 lb

## 2017-04-02 DIAGNOSIS — R030 Elevated blood-pressure reading, without diagnosis of hypertension: Secondary | ICD-10-CM | POA: Diagnosis not present

## 2017-04-02 DIAGNOSIS — N182 Chronic kidney disease, stage 2 (mild): Secondary | ICD-10-CM

## 2017-04-02 DIAGNOSIS — I10 Essential (primary) hypertension: Secondary | ICD-10-CM

## 2017-04-02 DIAGNOSIS — Z0000 Encounter for general adult medical examination without abnormal findings: Secondary | ICD-10-CM

## 2017-04-02 HISTORY — DX: Essential (primary) hypertension: I10

## 2017-04-02 LAB — LIPID PANEL W/REFLEX DIRECT LDL
CHOLESTEROL: 145 mg/dL (ref <200)
HDL: 44 mg/dL (ref >40)
LDL Cholesterol (Calc): 81 mg/dL (calc)
Non-HDL Cholesterol (Calc): 101 mg/dL (calc) (ref <130)
Total CHOL/HDL Ratio: 3.3 (calc) (ref <5.0)
Triglycerides: 107 mg/dL (ref <150)

## 2017-04-02 LAB — COMPLETE METABOLIC PANEL WITH GFR
AG RATIO: 2 (calc) (ref 1.0–2.5)
ALKALINE PHOSPHATASE (APISO): 62 U/L (ref 40–115)
ALT: 46 U/L (ref 9–46)
AST: 26 U/L (ref 10–40)
Albumin: 4.4 g/dL (ref 3.6–5.1)
BILIRUBIN TOTAL: 0.6 mg/dL (ref 0.2–1.2)
BUN: 14 mg/dL (ref 7–25)
CO2: 28 mmol/L (ref 20–32)
Calcium: 9.5 mg/dL (ref 8.6–10.3)
Chloride: 103 mmol/L (ref 98–110)
Creat: 1.1 mg/dL (ref 0.60–1.35)
GFR, Est African American: 92 mL/min/{1.73_m2} (ref >=60)
GFR, Est Non African American: 80 mL/min/{1.73_m2} (ref >=60)
GLOBULIN: 2.2 g/dL (ref 1.9–3.7)
Glucose, Bld: 102 mg/dL — ABNORMAL HIGH (ref 65–99)
POTASSIUM: 4.5 mmol/L (ref 3.5–5.3)
SODIUM: 139 mmol/L (ref 135–146)
Total Protein: 6.6 g/dL (ref 6.1–8.1)

## 2017-04-02 LAB — CBC
HEMATOCRIT: 47.2 % (ref 38.5–50.0)
Hemoglobin: 16.3 g/dL (ref 13.2–17.1)
MCH: 30.6 pg (ref 27.0–33.0)
MCHC: 34.5 g/dL (ref 32.0–36.0)
MCV: 88.7 fL (ref 80.0–100.0)
MPV: 9.3 fL (ref 7.5–12.5)
Platelets: 321 10*3/uL (ref 140–400)
RBC: 5.32 10*6/uL (ref 4.20–5.80)
RDW: 12.1 % (ref 11.0–15.0)
WBC: 6.4 10*3/uL (ref 3.8–10.8)

## 2017-04-02 NOTE — Progress Notes (Signed)
       Tyler Moody is a 46 y.o. male who presents to [place]: [place] today for well adult visit. Tyler Moody is doing well overall. He has a history of elevated blood pressure/hypertension. This typically is well-controlled with diet and exercise. He's regained some weight and stopped exercising as much. He feels well with no chest pain palpitations or shortness of breath.   Past Medical History:  Diagnosis Date  . Heart murmur    Past Surgical History:  Procedure Laterality Date  . FINGER FRACTURE SURGERY Left    thumb   Social History  Substance Use Topics  . Smoking status: Never Smoker  . Smokeless tobacco: Not on file  . Alcohol use 0.5 - 1.0 oz/week    1 - 2 drink(s) per week   family history includes Cancer in his mother; Hypertension in his father.  ROS as above: No headache, visual changes, nausea, vomiting, diarrhea, constipation, dizziness, abdominal pain, skin rash, fevers, chills, night sweats, weight loss, swollen lymph nodes, body aches, joint swelling, muscle aches, chest pain, shortness of breath, mood changes, visual or auditory hallucinations.    Medications: No current outpatient prescriptions on file.   No current facility-administered medications for this visit.    No Known Allergies  Health Maintenance Health Maintenance  Topic Date Due  . TETANUS/TDAP  02/22/2024  . INFLUENZA VACCINE  Completed  . HIV Screening  Completed     Exam:  BP 134/89   Pulse 64   Wt 224 lb (101.6 kg)   BMI 31.24 kg/m   Wt Readings from Last 10 Encounters:  04/02/17 224 lb (101.6 kg)  03/28/16 211 lb (95.7 kg)  09/26/15 208 lb (94.3 kg)  03/28/15 206 lb (93.4 kg)  06/01/14 229 lb (103.9 kg)  03/26/14 227 lb (103 kg)  02/26/14 232 lb (105.2 kg)  02/21/14 225 lb (102.1 kg)  11/09/13 228 lb (103.4 kg)  10/12/13 223 lb (101.2 kg)    Gen: Well  NAD HEENT: EOMI,  MMM Lungs: Normal work of breathing. CTABL Heart: RRR no MRG Abd: NABS, Soft. Nondistended, Nontender Exts: Brisk capillary refill, warm and well perfused.   Depression screen PHQ 2/9 04/02/2017  Decreased Interest 0  Down, Depressed, Hopeless 0  PHQ - 2 Score 0    No results found for this or any previous visit (from the past 72 hour(s)). No results found.    Assessment and Plan: 46 y.o. male with Well adult. Doing reasonable overall. We'll check basic fasting labs. Blood pressure is a bit elevated today. Work on diet and lifestyle changes and recheck in about 6 months. We spent some time discussing possible medication therapy.   Orders Placed This Encounter  Procedures  . CBC  . COMPLETE METABOLIC PANEL WITH GFR  . Lipid Panel w/reflex Direct LDL   No orders of the defined types were placed in this encounter.    Discussed warning signs or symptoms. Please see discharge instructions. Patient expresses understanding.

## 2017-04-02 NOTE — Patient Instructions (Signed)
Thank you for coming in today. Work on diet and exercise.  Try to get down around 200. Return for recheck in 6 months.  We may consider Lisinopril for blood pressure control.    Lisinopril tablets What is this medicine? LISINOPRIL (lyse IN oh pril) is an ACE inhibitor. This medicine is used to treat high blood pressure and heart failure. It is also used to protect the heart immediately after a heart attack. This medicine may be used for other purposes; ask your health care provider or pharmacist if you have questions. COMMON BRAND NAME(S): Prinivil, Zestril What should I tell my health care provider before I take this medicine? They need to know if you have any of these conditions: -diabetes -heart or blood vessel disease -kidney disease -low blood pressure -previous swelling of the tongue, face, or lips with difficulty breathing, difficulty swallowing, hoarseness, or tightening of the throat -an unusual or allergic reaction to lisinopril, other ACE inhibitors, insect venom, foods, dyes, or preservatives -pregnant or trying to get pregnant -breast-feeding How should I use this medicine? Take this medicine by mouth with a glass of water. Follow the directions on your prescription label. You may take this medicine with or without food. If it upsets your stomach, take it with food. Take your medicine at regular intervals. Do not take it more often than directed. Do not stop taking except on your doctor's advice. Talk to your pediatrician regarding the use of this medicine in children. Special care may be needed. While this drug may be prescribed for children as young as 10 years of age for selected conditions, precautions do apply. Overdosage: If you think you have taken too much of this medicine contact a poison control center or emergency room at once. NOTE: This medicine is only for you. Do not share this medicine with others. What if I miss a dose? If you miss a dose, take it as soon as  you can. If it is almost time for your next dose, take only that dose. Do not take double or extra doses. What may interact with this medicine? Do not take this medicine with any of the following medications: -hymenoptera venom -sacubitril; valsartan This medicines may also interact with the following medications: -aliskiren -angiotensin receptor blockers, like losartan or valsartan -certain medicines for diabetes -diuretics -everolimus -gold compounds -lithium -NSAIDs, medicines for pain and inflammation, like ibuprofen or naproxen -potassium salts or supplements -salt substitutes -sirolimus -temsirolimus This list may not describe all possible interactions. Give your health care provider a list of all the medicines, herbs, non-prescription drugs, or dietary supplements you use. Also tell them if you smoke, drink alcohol, or use illegal drugs. Some items may interact with your medicine. What should I watch for while using this medicine? Visit your doctor or health care professional for regular check ups. Check your blood pressure as directed. Ask your doctor what your blood pressure should be, and when you should contact him or her. Do not treat yourself for coughs, colds, or pain while you are using this medicine without asking your doctor or health care professional for advice. Some ingredients may increase your blood pressure. Women should inform their doctor if they wish to become pregnant or think they might be pregnant. There is a potential for serious side effects to an unborn child. Talk to your health care professional or pharmacist for more information. Check with your doctor or health care professional if you get an attack of severe diarrhea, nausea and vomiting, or  if you sweat a lot. The loss of too much body fluid can make it dangerous for you to take this medicine. You may get drowsy or dizzy. Do not drive, use machinery, or do anything that needs mental alertness until you  know how this drug affects you. Do not stand or sit up quickly, especially if you are an older patient. This reduces the risk of dizzy or fainting spells. Alcohol can make you more drowsy and dizzy. Avoid alcoholic drinks. Avoid salt substitutes unless you are told otherwise by your doctor or health care professional. What side effects may I notice from receiving this medicine? Side effects that you should report to your doctor or health care professional as soon as possible: -allergic reactions like skin rash, itching or hives, swelling of the hands, feet, face, lips, throat, or tongue -breathing problems -signs and symptoms of kidney injury like trouble passing urine or change in the amount of urine -signs and symptoms of increased potassium like muscle weakness; chest pain; or fast, irregular heartbeat -signs and symptoms of liver injury like dark yellow or brown urine; general ill feeling or flu-like symptoms; light-colored stools; loss of appetite; nausea; right upper belly pain; unusually weak or tired; yellowing of the eyes or skin -signs and symptoms of low blood pressure like dizziness; feeling faint or lightheaded, falls; unusually weak or tired -stomach pain with or without nausea and vomiting Side effects that usually do not require medical attention (report to your doctor or health care professional if they continue or are bothersome): -changes in taste -cough -dizziness -fever -headache -sensitivity to light This list may not describe all possible side effects. Call your doctor for medical advice about side effects. You may report side effects to FDA at 1-800-FDA-1088. Where should I keep my medicine? Keep out of the reach of children. Store at room temperature between 15 and 30 degrees C (59 and 86 degrees F). Protect from moisture. Keep container tightly closed. Throw away any unused medicine after the expiration date. NOTE: This sheet is a summary. It may not cover all possible  information. If you have questions about this medicine, talk to your doctor, pharmacist, or health care provider.  2018 [employer]vier/Gold Standard (2015-08-08 12:52:35)

## 2017-10-01 ENCOUNTER — Ambulatory Visit (INDEPENDENT_AMBULATORY_CARE_PROVIDER_SITE_OTHER): Payer: 59 | Admitting: Family Medicine

## 2017-10-01 ENCOUNTER — Encounter: Payer: Self-pay | Admitting: Family Medicine

## 2017-10-01 VITALS — BP 143/98 | HR 64 | Ht 71.0 in | Wt 213.0 lb

## 2017-10-01 DIAGNOSIS — R030 Elevated blood-pressure reading, without diagnosis of hypertension: Secondary | ICD-10-CM

## 2017-10-01 DIAGNOSIS — R739 Hyperglycemia, unspecified: Secondary | ICD-10-CM | POA: Diagnosis not present

## 2017-10-01 DIAGNOSIS — N182 Chronic kidney disease, stage 2 (mild): Secondary | ICD-10-CM

## 2017-10-01 LAB — POCT UA - MICROALBUMIN
CREATININE, POC: 10 mg/dL
Microalbumin Ur, POC: 10 mg/L

## 2017-10-01 NOTE — Patient Instructions (Signed)
Thank you for coming in today. Get labs this morning.  Get the urine sample before you leave.  Keep a home blood pressure log for 1 month.  Make sure to check your meter against the nurse meter at work.  If all is well recheck for well visit in October.  If we decide to start start treatment I will likely pick Lisinopril.  Weight loss will help as well.   Lisinopril tablets What is this medicine? LISINOPRIL (lyse [place] oh pril) is an ACE inhibitor. This medicine is used to treat high blood pressure and heart failure. It is also used to protect the heart immediately after a heart attack. This medicine may be used for other purposes; ask your health care provider or pharmacist if you have questions. COMMON BRAND NAME(S): Prinivil, Zestril What should I tell my health care provider before I take this medicine? They need to know if you have any of these conditions: -diabetes -heart or blood vessel disease -kidney disease -low blood pressure -previous swelling of the tongue, face, or lips with difficulty breathing, difficulty swallowing, hoarseness, or tightening of the throat -an unusual or allergic reaction to lisinopril, other ACE inhibitors, insect venom, foods, dyes, or preservatives -pregnant or trying to get pregnant -breast-feeding How should I use this medicine? Take this medicine by mouth with a glass of water. Follow the directions on your prescription label. You may take this medicine with or without food. If it upsets your stomach, take it with food. Take your medicine at regular intervals. Do not take it more often than directed. Do not stop taking except on your doctor's advice. Talk to your pediatrician regarding the use of this medicine in children. Special care may be needed. While this drug may be prescribed for children as young as 58 years of age for selected conditions, precautions do apply. Overdosage: If you think you have taken too much of this medicine contact a poison  control center or emergency room at once. NOTE: This medicine is only for you. Do not share this medicine with others. What if I miss a dose? If you miss a dose, take it as soon as you can. If it is almost time for your next dose, take only that dose. Do not take double or extra doses. What may interact with this medicine? Do not take this medicine with any of the following medications: -hymenoptera venom -sacubitril; valsartan This medicines may also interact with the following medications: -aliskiren -angiotensin receptor blockers, like losartan or valsartan -certain medicines for diabetes -diuretics -everolimus -gold compounds -lithium -NSAIDs, medicines for pain and inflammation, like ibuprofen or naproxen -potassium salts or supplements -salt substitutes -sirolimus -temsirolimus This list may not describe all possible interactions. Give your health care provider a list of all the medicines, herbs, non-prescription drugs, or dietary supplements you use. Also tell them if you smoke, drink alcohol, or use illegal drugs. Some items may interact with your medicine. What should I watch for while using this medicine? Visit your doctor or health care professional for regular check ups. Check your blood pressure as directed. Ask your doctor what your blood pressure should be, and when you should contact him or her. Do not treat yourself for coughs, colds, or pain while you are using this medicine without asking your doctor or health care professional for advice. Some ingredients may increase your blood pressure. Women should inform their doctor if they wish to become pregnant or think they might be pregnant. There is a potential for  serious side effects to an unborn child. Talk to your health care professional or pharmacist for more information. Check with your doctor or health care professional if you get an attack of severe diarrhea, nausea and vomiting, or if you sweat a lot. The loss of too  much body fluid can make it dangerous for you to take this medicine. You may get drowsy or dizzy. Do not drive, use machinery, or do anything that needs mental alertness until you know how this drug affects you. Do not stand or sit up quickly, especially if you are an older patient. This reduces the risk of dizzy or fainting spells. Alcohol can make you more drowsy and dizzy. Avoid alcoholic drinks. Avoid salt substitutes unless you are told otherwise by your doctor or health care professional. What side effects may I notice from receiving this medicine? Side effects that you should report to your doctor or health care professional as soon as possible: -allergic reactions like skin rash, itching or hives, swelling of the hands, feet, face, lips, throat, or tongue -breathing problems -signs and symptoms of kidney injury like trouble passing urine or change in the amount of urine -signs and symptoms of increased potassium like muscle weakness; chest pain; or fast, irregular heartbeat -signs and symptoms of liver injury like dark yellow or brown urine; general ill feeling or flu-like symptoms; light-colored stools; loss of appetite; nausea; right upper belly pain; unusually weak or tired; yellowing of the eyes or skin -signs and symptoms of low blood pressure like dizziness; feeling faint or lightheaded, falls; unusually weak or tired -stomach pain with or without nausea and vomiting Side effects that usually do not require medical attention (report to your doctor or health care professional if they continue or are bothersome): -changes in taste -cough -dizziness -fever -headache -sensitivity to light This list may not describe all possible side effects. Call your doctor for medical advice about side effects. You may report side effects to FDA at 1-800-FDA-1088. Where should I keep my medicine? Keep out of the reach of children. Store at room temperature between 15 and 30 degrees C (59 and 86  degrees F). Protect from moisture. Keep container tightly closed. Throw away any unused medicine after the expiration date. NOTE: This sheet is a summary. It may not cover all possible information. If you have questions about this medicine, talk to your doctor, pharmacist, or health care provider.  2018 Elsevier/Gold Standard (2015-08-08 12:52:35)

## 2017-10-01 NOTE — Progress Notes (Signed)
       Tyler Moody is a 47 y.o. male who presents to [facility] today for blood pressure follow up.   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 He also states he believes he can achieve more lifestyle interventions prior to starting medications. He denies chest pain and shortness of breath. Patient has idiopathic stage 2 CKD and wishes for it to improve. Patient states feeling healthy. Patient does not currently keep a BP log.    Past Medical History:  Diagnosis Date  . Heart murmur    Past Surgical History:  Procedure Laterality Date  . FINGER FRACTURE SURGERY Left    thumb   Social History   Tobacco Use  . Smoking status: Never Smoker  . Smokeless tobacco: Never Used  Substance Use Topics  . Alcohol use: Yes    Alcohol/week: 0.5 - 1.0 oz    Types: 1 - 2 Standard drinks or equivalent per week   family history includes Cancer in his mother; Hypertension in his father.  ROS as above:  Medications: No current outpatient medications on file.   No current facility-administered medications for this visit.    No Known Allergies  Health Maintenance Health Maintenance  Topic Date Due  . INFLUENZA VACCINE  01/30/2018  . TETANUS/TDAP  02/22/2024  . HIV Screening  Completed     Exam:  BP (!) 143/98   Pulse 64   Ht 5\' 11"  (1.803 m)   Wt 213 lb (96.6 kg)   BMI 29.71 kg/m  Gen: Well NAD HEENT: EOMI,  MMM Lungs: Normal work of breathing. CTABL Heart: RRR no MRG Abd: NABS, Soft. Nondistended, Nontender Exts: Brisk capillary refill, warm and well perfused.    Results for orders placed or performed in visit on 10/01/17 (from the past 72 hour(s))  POCT UA - Microalbumin     Status: None   Collection Time: 10/01/17  9:24 AM  Result Value Ref Range   Microalbumin Ur, POC 10 mg/L   Creatinine, POC 10 mg/dL   Albumin/Creatinine Ratio, Urine, POC <30    No results found.    Assessment and Plan: 47 y.o. male with borderline high blood pressure.  The patient's blood pressure today and at prior visits has been right on the border of treatable hypertension. Because of his reluctance to take medication and stated desire to modify his lifestyle we will wait to start Ace inhibitors. We will check his blood creatinine and urine protein levels to reassure [place] the CKD has not progressed. We discussed keeping a BP log and reporting back through mychart his collected data. If his BP remains elevated despite lifestyle changes, we will discuss starting medications. Patient will attempt to exercise, eat healthy, and manage stress levels.   Check A1c based on mild elevated blood sugar on fasting labs in October of 2018.    Orders Placed This Encounter  Procedures  . Renal Function Panel  . Hemoglobin A1c  . POCT UA - Microalbumin   No orders of the defined types were placed in this encounter.    Discussed warning signs or symptoms. Please see discharge instructions. Patient expresses understanding.

## 2017-10-02 LAB — RENAL FUNCTION PANEL
Albumin: 4.3 g/dL (ref 3.6–5.1)
BUN: 15 mg/dL (ref 7–25)
CALCIUM: 9.4 mg/dL (ref 8.6–10.3)
CHLORIDE: 104 mmol/L (ref 98–110)
CO2: 30 mmol/L (ref 20–32)
Creat: 0.97 mg/dL (ref 0.60–1.35)
GLUCOSE: 91 mg/dL (ref 65–99)
PHOSPHORUS: 3.7 mg/dL (ref 2.5–4.5)
Potassium: 4.5 mmol/L (ref 3.5–5.3)
Sodium: 138 mmol/L (ref 135–146)

## 2017-10-02 LAB — HEMOGLOBIN A1C
HEMOGLOBIN A1C: 5.3 %{Hb} (ref <5.7)
Mean Plasma Glucose: 105 (calc)
eAG (mmol/L): 5.8 (calc)

## 2017-12-07 ENCOUNTER — Emergency Department
Admission: EM | Admit: 2017-12-07 | Discharge: 2017-12-07 | Disposition: A | Discharge status: Home / Self Care | Payer: 59 | Source: Home / Self Care | Attending: Family Medicine | Admitting: Family Medicine

## 2017-12-07 ENCOUNTER — Other Ambulatory Visit: Payer: Self-pay

## 2017-12-07 DIAGNOSIS — L255 Unspecified contact dermatitis due to plants, except food: Secondary | ICD-10-CM

## 2017-12-07 MED ORDER — PREDNISONE 20 MG PO TABS: ORAL_TABLET | ORAL | 0 refills | Status: DC

## 2017-12-07 NOTE — ED Triage Notes (Signed)
Pt c/o rash/itching all over body. Thinks it is from poison ivy. Did some yardwork on Tuesday. That's when he noticed the rash starting. Neck, hands, L leg and tops of feet.

## 2017-12-07 NOTE — Discharge Instructions (Addendum)
May take Benadryl at bedtime for itching. °

## 2017-12-07 NOTE — ED Provider Notes (Signed)
[facility]    CSN: [id number] Arrival date & time: 12/07/17  1201     History   Chief Complaint Chief Complaint  Patient presents with  . Poison Ivy    HPI Tyler Moody is a 47 y.o. male.   While working in yard four days ago patient came into contact with poison ivy.  He subsequently developed pruritic rash on neck, hands, chest, feet, and a few small lesions on face.  The history is provided by the patient.  Poison Karlene Einstein  This is a new problem. Episode onset: 4 days ago. The problem occurs constantly. The problem has been rapidly worsening. Exacerbated by: heat. Nothing relieves the symptoms. He has tried nothing for the symptoms.    Past Medical History:  Diagnosis Date  . Heart murmur     Patient Active Problem List   Diagnosis Date Noted  . Transient elevated blood pressure 04/02/2017  . Patellofemoral arthritis 10/12/2013  . Left inguinal hernia 01/06/2013  . Chronic renal insufficiency, stage II (mild) 12/25/2012  . Heart murmur 12/22/2012    Past Surgical History:  Procedure Laterality Date  . FINGER FRACTURE SURGERY Left    thumb       Home Medications    Prior to Admission medications   Medication Sig Start Date End Date Taking? Authorizing Provider  predniSONE (DELTASONE) 20 MG tablet Take one tab by mouth twice daily for 4 days, then one daily for 3 days. Take with food. 12/07/17   Kandra Nicolas, MD    Family History Family History  Problem Relation Age of Onset  . Cancer Mother        breast CA  . Hypertension Father     Social History Social History   Tobacco Use  . Smoking status: Never Smoker  . Smokeless tobacco: Never Used  Substance Use Topics  . Alcohol use: Yes    Alcohol/week: 0.5 - 1.0 oz    Types: 1 - 2 Standard drinks or equivalent per week  . Drug use: No     Allergies   Patient has no known allergies.   Review of Systems Review of Systems  All other systems reviewed and are  negative.    Physical Exam Triage Vital Signs ED Triage Vitals  Enc Vitals Group     BP 12/07/17 1214 (!) 151/101     Pulse Rate 12/07/17 1214 62     Resp 12/07/17 1214 18     Temp 12/07/17 1214 97.9 F (36.6 C)     Temp Source 12/07/17 1214 Oral     SpO2 12/07/17 1214 95 %     Weight 12/07/17 1215 209 lb (94.8 kg)     Height 12/07/17 1215 5\' 11"  (1.803 m)     Head Circumference --      Peak Flow --      Pain Score 12/07/17 1215 0     Pain Loc --      Pain Edu? --      Excl. in [place]? --    No data found.  Updated Vital Signs BP (!) 151/101 (BP Location: Right Arm)   Pulse 62   Temp 97.9 F (36.6 C) (Oral)   Resp 18   Ht 5\' 11"  (1.803 m)   Wt 209 lb (94.8 kg)   SpO2 95%   BMI 29.15 kg/m   Visual Acuity Right Eye Distance:   Left Eye Distance:   Bilateral Distance:    Right Eye Near:  Left Eye Near:    Bilateral Near:     Physical Exam  Constitutional: He appears well-developed and well-nourished. No distress.  HENT:  Head: Normocephalic.    Right Ear: External ear normal.  Left Ear: External ear normal.  Nose: Nose normal.  Mouth/Throat: Oropharynx is clear and moist.  Eyes: Pupils are equal, round, and reactive to light. Conjunctivae are normal.  Cardiovascular: Normal rate.  Pulmonary/Chest: Effort normal.  Musculoskeletal: He exhibits no edema.  Lymphadenopathy:    He has no cervical adenopathy.  Neurological: He is alert.  Skin: Skin is warm and dry. Rash noted. Rash is maculopapular.     Scattered small 2 to 53mm diameter erythematous maculopapular lesions in distribution as noted on diagram.  There is some scattered erythema on face without swelling.  Nursing note and vitals reviewed.    UC Treatments / Results  Labs (all labs ordered are listed, but only abnormal results are displayed) Labs Reviewed - No data to display  EKG None  Radiology No results found.  Procedures Procedures (including critical care time)  Medications  Ordered in UC Medications - No data to display  Initial Impression / Assessment and Plan / UC Course  I have reviewed the triage vital signs and the nursing notes.  Pertinent labs & imaging results that were available during my care of the patient were reviewed by me and considered in my medical decision making (see chart for details).    Begin prednisone burst/taper. Followup with Family Doctor if not improved in about 5 days.  Final Clinical Impressions(s) / UC Diagnoses   Final diagnoses:  Rhus dermatitis     Discharge Instructions     May take Benadryl at bedtime for itching.    ED Prescriptions    Medication Sig Dispense Auth. Provider   predniSONE (DELTASONE) 20 MG tablet Take one tab by mouth twice daily for 4 days, then one daily for 3 days. Take with food. 11 tablet Kandra Nicolas, MD        Kandra Nicolas, MD 12/09/17 [phone]

## 2018-03-14 ENCOUNTER — Ambulatory Visit (INDEPENDENT_AMBULATORY_CARE_PROVIDER_SITE_OTHER): Payer: 59 | Admitting: Family Medicine

## 2018-03-14 ENCOUNTER — Encounter: Payer: Self-pay | Admitting: Family Medicine

## 2018-03-14 VITALS — BP 143/84 | HR 68 | Ht 71.5 in | Wt 221.0 lb

## 2018-03-14 DIAGNOSIS — I10 Essential (primary) hypertension: Secondary | ICD-10-CM

## 2018-03-14 DIAGNOSIS — L237 Allergic contact dermatitis due to plants, except food: Secondary | ICD-10-CM | POA: Diagnosis not present

## 2018-03-14 DIAGNOSIS — L0100 Impetigo, unspecified: Secondary | ICD-10-CM

## 2018-03-14 MED ORDER — MUPIROCIN 2 % EX OINT
TOPICAL_OINTMENT | CUTANEOUS | 3 refills | Status: DC
Start: 2018-03-14 — End: 2018-10-21

## 2018-03-14 MED ORDER — TRIAMCINOLONE ACETONIDE 0.5 % EX CREA: 1.0000 "application " | TOPICAL_CREAM | Freq: Two times a day (BID) | CUTANEOUS | 3 refills | Status: DC

## 2018-03-14 NOTE — Patient Instructions (Addendum)
Thank you for coming in today. Apply triamcinolone cream to the rash.  Use mupericin ointment on the infection.  Recheck if not getting better.   Keep track of blood pressure.    DASH Eating Plan DASH stands for "Dietary Approaches to Stop Hypertension." The DASH eating plan is a healthy eating plan that has been shown to reduce high blood pressure (hypertension). It may also reduce your risk for type 2 diabetes, heart disease, and stroke. The DASH eating plan may also help with weight loss. What are tips for following this plan? General guidelines  Avoid eating more than 2,300 mg (milligrams) of salt (sodium) a day. If you have hypertension, you may need to reduce your sodium intake to 1,500 mg a day.  Limit alcohol intake to no more than 1 drink a day for nonpregnant women and 2 drinks a day for men. One drink equals 12 oz of beer, 5 oz of wine, or 1 oz of hard liquor.  Work with your health care provider to maintain a healthy body weight or to lose weight. Ask what an ideal weight is for you.  Get at least 30 minutes of exercise that causes your heart to beat faster (aerobic exercise) most days of the week. Activities may include walking, swimming, or biking.  Work with your health care provider or diet and nutrition specialist (dietitian) to adjust your eating plan to your individual calorie needs. Reading food labels  Check food labels for the amount of sodium per serving. Choose foods with less than 5 percent of the Daily Value of sodium. Generally, foods with less than 300 mg of sodium per serving fit into this eating plan.  To find whole grains, look for the word "whole" as the first word in the ingredient list. Shopping  Buy products labeled as "low-sodium" or "no salt added."  Buy fresh foods. Avoid canned foods and premade or frozen meals. Cooking  Avoid adding salt when cooking. Use salt-free seasonings or herbs instead of table salt or sea salt. Check with your health  care provider or pharmacist before using salt substitutes.  Do not fry foods. Cook foods using healthy methods such as baking, boiling, grilling, and broiling instead.  Cook with heart-healthy oils, such as olive, canola, soybean, or sunflower oil. Meal planning   Eat a balanced diet that includes: ? 5 or more servings of fruits and vegetables each day. At each meal, try to fill half of your plate with fruits and vegetables. ? Up to 6-8 servings of whole grains each day. ? Less than 6 oz of lean meat, poultry, or fish each day. A 3-oz serving of meat is about the same size as a deck of cards. One egg equals 1 oz. ? 2 servings of low-fat dairy each day. ? A serving of nuts, seeds, or beans 5 times each week. ? Heart-healthy fats. Healthy fats called Omega-3 fatty acids are found in foods such as flaxseeds and coldwater fish, like sardines, salmon, and mackerel.  Limit how much you eat of the following: ? Canned or prepackaged foods. ? Food that is high in trans fat, such as fried foods. ? Food that is high in saturated fat, such as fatty meat. ? Sweets, desserts, sugary drinks, and other foods with added sugar. ? Full-fat dairy products.  Do not salt foods before eating.  Try to eat at least 2 vegetarian meals each week.  Eat more home-cooked food and less restaurant, buffet, and fast food.  When eating at  a restaurant, ask that your food be prepared with less salt or no salt, if possible. What foods are recommended? The items listed may not be a complete list. Talk with your dietitian about what dietary choices are best for you. Grains Whole-grain or whole-wheat bread. Whole-grain or whole-wheat pasta. Brown rice. [place]. Bulgur. Whole-grain and low-sodium cereals. Pita bread. Low-fat, low-sodium crackers. Whole-wheat flour tortillas. Vegetables Fresh or frozen vegetables (raw, steamed, roasted, or grilled). Low-sodium or reduced-sodium tomato and vegetable juice.  Low-sodium or reduced-sodium tomato sauce and tomato paste. Low-sodium or reduced-sodium canned vegetables. Fruits All fresh, dried, or frozen fruit. Canned fruit in natural juice (without added sugar). Meat and other protein foods Skinless chicken or [place]. Ground chicken or [place]. Pork with fat trimmed off. Fish and seafood. Egg whites. Dried beans, peas, or lentils. Unsalted nuts, nut butters, and seeds. Unsalted canned beans. Lean cuts of beef with fat trimmed off. Low-sodium, lean deli meat. Dairy Low-fat (1%) or fat-free (skim) milk. Fat-free, low-fat, or reduced-fat cheeses. Nonfat, low-sodium ricotta or cottage cheese. Low-fat or nonfat yogurt. Low-fat, low-sodium cheese. Fats and oils Soft margarine without trans fats. Vegetable oil. Low-fat, reduced-fat, or light mayonnaise and salad dressings (reduced-sodium). Canola, safflower, olive, soybean, and sunflower oils. Avocado. Seasoning and other foods Herbs. Spices. Seasoning mixes without salt. Unsalted popcorn and pretzels. Fat-free sweets. What foods are not recommended? The items listed may not be a complete list. Talk with your dietitian about what dietary choices are best for you. Grains Baked goods made with fat, such as croissants, muffins, or some breads. Dry pasta or rice meal packs. Vegetables Creamed or fried vegetables. Vegetables in a cheese sauce. Regular canned vegetables (not low-sodium or reduced-sodium). Regular canned tomato sauce and paste (not low-sodium or reduced-sodium). Regular tomato and vegetable juice (not low-sodium or reduced-sodium). Angie Fava. Olives. Fruits Canned fruit in a light or heavy syrup. Fried fruit. Fruit in cream or butter sauce. Meat and other protein foods Fatty cuts of meat. Ribs. Fried meat. Berniece Salines. Sausage. Bologna and other processed lunch meats. Salami. Fatback. Hotdogs. Bratwurst. Salted nuts and seeds. Canned beans with added salt. Canned or smoked fish. Whole eggs or egg yolks. Chicken  or [place] with skin. Dairy Whole or 2% milk, cream, and half-and-half. Whole or full-fat cream cheese. Whole-fat or sweetened yogurt. Full-fat cheese. Nondairy creamers. Whipped toppings. Processed cheese and cheese spreads. Fats and oils Butter. Stick margarine. Lard. Shortening. Ghee. Bacon fat. Tropical oils, such as coconut, palm kernel, or palm oil. Seasoning and other foods Salted popcorn and pretzels. Onion salt, garlic salt, seasoned salt, table salt, and sea salt. Worcestershire sauce. Tartar sauce. Barbecue sauce. Teriyaki sauce. Soy sauce, including reduced-sodium. Steak sauce. Canned and packaged gravies. Fish sauce. Oyster sauce. Cocktail sauce. Horseradish that you find on the shelf. Ketchup. Mustard. Meat flavorings and tenderizers. Bouillon cubes. Hot sauce and Tabasco sauce. Premade or packaged marinades. Premade or packaged taco seasonings. Relishes. Regular salad dressings. Where to find more information:  National Heart, Lung, and [place]: https://wilson-eaton.com/  American Heart Association: www.heart.org Summary  The DASH eating plan is a healthy eating plan that has been shown to reduce high blood pressure (hypertension). It may also reduce your risk for type 2 diabetes, heart disease, and stroke.  With the DASH eating plan, you should limit salt (sodium) intake to 2,300 mg a day. If you have hypertension, you may need to reduce your sodium intake to 1,500 mg a day.  When on the DASH eating plan, aim to eat  more fresh fruits and vegetables, whole grains, lean proteins, low-fat dairy, and heart-healthy fats.  Work with your health care provider or diet and nutrition specialist (dietitian) to adjust your eating plan to your individual calorie needs. This information is not intended to replace advice given to you by your health care provider. Make sure you discuss any questions you have with your health care provider. Document Released: 06/07/2011 Document Revised:  06/11/2016 Document Reviewed: 06/11/2016 Elsevier Interactive Patient Education  2018 [place] Dermatitis Poison ivy dermatitis is inflammation of the skin that is caused by the allergens on the leaves of the poison ivy plant. The skin reaction often involves redness, swelling, blisters, and extreme itching. What are the causes? This condition is caused by a specific chemical (urushiol) found in the sap of the poison ivy plant. This chemical is sticky and can be easily spread to people, animals, and objects. You can get poison ivy dermatitis by:  Having direct contact with a poison ivy plant.  Touching animals, other people, or objects that have come in contact with poison ivy and have the chemical on them.  What increases the risk? This condition is more likely to develop in:  People who are outdoors often.  People who go outdoors without wearing protective clothing, such as closed shoes, long pants, and a long-sleeved shirt.  What are the signs or symptoms? Symptoms of this condition include:  Redness and itching.  A rash that often includes bumps and blisters. The rash usually appears 48 hours after exposure.  Swelling. This may occur if the reaction is more severe.  Symptoms usually last for 1-2 weeks. However, the first time you develop this condition, symptoms may last 3-4 weeks. How is this diagnosed? This condition may be diagnosed based on your symptoms and a physical exam. Your health care provider may also ask you about any recent outdoor activity. How is this treated? Treatment for this condition will vary depending on how severe it is. Treatment may include:  Hydrocortisone creams or calamine lotions to relieve itching.  Oatmeal baths to soothe the skin.  Over-the-counter antihistamine tablets.  Oral steroid medicine for more severe outbreaks.  Follow these instructions at home:  Take or apply over-the-counter and prescription medicines  only as told by your health care provider.  Wash exposed skin as soon as possible with soap and cold water.  Use hydrocortisone creams or calamine lotion as needed to soothe the skin and relieve itching.  Take oatmeal baths as needed. Use colloidal oatmeal. You can get this at your local pharmacy or grocery store. Follow the instructions on the packaging.  Do not scratch or rub your skin.  While you have the rash, wash clothes right after you wear them. How is this prevented?  Learn to identify the poison ivy plant and avoid contact with the plant. This plant can be recognized by the number of leaves. Generally, poison ivy has three leaves with flowering branches on a single stem. The leaves are typically glossy, and they have jagged edges that come to a point at the front.  If you have been exposed to poison ivy, thoroughly wash with soap and water right away. You have about 30 minutes to remove the plant resin before it will cause the rash. Be sure to wash under your fingernails because any plant resin there will continue to spread the rash.  When hiking or camping, wear clothes that will help you to avoid exposure on the  skin. This includes long pants, a long-sleeved shirt, tall socks, and hiking boots. You can also apply preventive lotion to your skin to help limit exposure.  If you suspect that your clothes or outdoor gear came in contact with poison ivy, rinse them off outside with a garden hose before you bring them inside your house. Contact a health care provider if:  You have open sores in the rash area.  You have more redness, swelling, or pain in the affected area.  You have redness that spreads beyond the rash area.  You have fluid, blood, or pus coming from the affected area.  You have a fever.  You have a rash over a large area of your body.  You have a rash on your eyes, mouth, or genitals.  Your rash does not improve after a few days. Get help right away  if:  Your face swells or your eyes swell shut.  You have trouble breathing.  You have trouble swallowing. This information is not intended to replace advice given to you by your health care provider. Make sure you discuss any questions you have with your health care provider. Document Released: 06/15/2000 Document Revised: 11/24/2015 Document Reviewed: 11/24/2014 Elsevier Interactive Patient Education  2018 [place], Adult Impetigo is an infection of the skin. It commonly occurs in young children, but it can also occur in adults. The infection causes itchy blisters and sores that produce brownish-yellow fluid. As the fluid dries, it forms a thick, honey-colored crust. These skin changes usually occur on the face but can also affect other areas of the body. Impetigo usually goes away in 7-10 days with treatment. What are the causes? Impetigo is caused by two types of bacteria. It may be caused by staphylococci or streptococci bacteria. These bacteria cause impetigo when they get under the surface of the skin. This often happens after some damage to the skin, such as damage from:  Cuts, scrapes, or scratches.  Insect bites, especially when you scratch the area of a bite.  Chickenpox or other illnesses that cause open skin sores.  Nail biting or chewing.  Impetigo is contagious and can spread easily from one person to another. This may occur through close skin contact or by sharing towels, clothing, or other items with a person who has the infection. What increases the risk? Some things that can increase the risk of getting this infection include:  Playing sports that include skin-to-skin contact with others.  Having a skin condition with open sores.  Having many skin cuts or scrapes.  Living in an area that has high humidity levels.  Having poor hygiene.  Having high levels of staphylococci in your nose.  What are the signs or symptoms? Impetigo usually starts  out as small blisters, often on the face. The blisters then break open and turn into tiny sores (lesions) with a yellow crust. In some cases, the blisters cause itching or burning. With scratching, irritation, or lack of treatment, these small lesions may get larger. Scratching can also cause impetigo to spread to other parts of the body. The bacteria can get under the fingernails and spread when you touch another area of your skin. Other possible symptoms include:  Larger blisters.  Pus.  Swollen lymph glands.  How is this diagnosed? This condition is usually diagnosed during a physical exam. A skin sample or sample of fluid from a blister may be taken for lab tests that involve growing bacteria (culture test). This can help  confirm the diagnosis or help determine the best treatment. How is this treated? Mild impetigo can be treated with prescription antibiotic cream. Oral antibiotic medicine may be used in more severe cases. Medicines for itching may also be used. Follow these instructions at home:  Take medicines only as directed by your health care provider.  To help prevent impetigo from spreading to other body areas: ? Keep your fingernails short and clean. ? Do not scratch the blisters or sores. ? Cover infected areas, if necessary, to keep from scratching.  Gently wash the infected areas with antibiotic soap and water.  Soak crusted areas in warm, soapy water using antibiotic soap. ? Gently rub the areas to remove crusts. Do not scrub.  Wash your hands often to avoid spreading this infection.  Stay home until you have used an antibiotic cream for 48 hours (2 days) or an oral antibiotic medicine for 24 hours (1 day). You should only return to work and activities with other people if your skin shows significant improvement. How is this prevented? To keep the infection from spreading:  Stay home until you have used an antibiotic cream for 48 hours or an oral antibiotic for 24  hours.  Wash your hands often.  Do not engage in skin-to-skin contact with other people while you have still have blisters.  Do not share towels, washcloths, or bedding with others while you have the infection.  Contact a health care provider if:  You develop more blisters or sores despite treatment.  Other family members get sores.  Your skin sores are not improving after 48 hours of treatment.  You have a fever. Get help right away if:  You see spreading redness or swelling of the skin around your sores.  You see red streaks coming from your sores.  You develop a sore throat. This information is not intended to replace advice given to you by your health care provider. Make sure you discuss any questions you have with your health care provider. Document Released: 07/09/2014 Document Revised: 11/24/2015 Document Reviewed: 06/01/2014 Elsevier Interactive Patient Education  2017 [employer].

## 2018-03-14 NOTE — Progress Notes (Signed)
Tyler Moody is a 47 y.o. male who presents to [facility] today for poison ivy dermatitis and hypertension.  Tyler Moody was exposed to poison ivy while gardening about 6 days ago.  He developed a rash on his bilateral arms.  He immediately cleaned his skin off and has been using over-the-counter poison ivy cleaning solution on his rash.  He notes the rash is not improving much.  He notes it is quite itchy and worse on his right wrist than his left.  He notes the right wrist rash has now become crusted but not particularly painful.  He denies fevers or chills nausea vomiting or diarrhea.  Additionally he notes his blood pressure continues to be elevated.  He is very reluctant to consider blood pressure medications.  He would like to work on lifestyle and diet.  He is not eating a low-sodium or low calorie diet.  He is exercising regularly however.   ROS as above:  Exam:  BP (!) 143/84   Pulse 68   Ht 5' 11.5" (1.816 m)   Wt 221 lb (100.2 kg)   BMI 30.39 kg/m  Wt Readings from Last 5 Encounters:  03/14/18 221 lb (100.2 kg)  12/07/17 209 lb (94.8 kg)  10/01/17 213 lb (96.6 kg)  04/02/17 224 lb (101.6 kg)  03/28/16 211 lb (95.7 kg)    Gen: Well NAD HEENT: EOMI,  MMM Lungs: Normal work of breathing. CTABL Heart: RRR no MRG Abd: NABS, Soft. Nondistended, Nontender Exts: Brisk capillary refill, warm and well perfused.  Skin: Left forearm with erythematous macular linear rash. Right forearm with erythematous raised circular rash with gold colored crust.  Not particularly tender.     Assessment and Plan: 47 y.o. male with  Poison ivy dermatitis with secondary bacterial infection with impetigo.  Plan to treat poison ivy dermatitis with triamcinolone cream.  Will treat impetigo with mupirocin antibiotic ointment.  Discussed plan for future poison ivy exposures.   Recommend using warm water with this detergent immediately and changing clothes immediately upon exposure.  However cautioned against using harsh soaps after the first day as that will interfere with wound healing and not further treat poison ivy dermatitis.  He can use triamcinolone cream for future rashes if needed.  Additionally patient has continued hypertension.  Based on review of his history he has had elevated blood pressure since 2016.  He is tried weight loss strategies in the past and managed to lose some weight but never keep it off.  He is very reluctant to consider blood pressure medications.  He denies chest pain palpitations or shortness of breath.  He is interested in trying the DASH diet. Recheck in 6 weeks.    No orders of the defined types were placed in this encounter.  Meds ordered this encounter  Medications  . triamcinolone cream (KENALOG) 0.5 %    Sig: Apply 1 application topically 2 (two) times daily. For poison ivy to affected areas.    Dispense:  60 g    Refill:  3  . mupirocin ointment (BACTROBAN) 2 %    Sig: Apply to affected area BID for 7 days for skin infection    Dispense:  30 g    Refill:  3     Historical information moved to improve visibility of documentation.  Past Medical History:  Diagnosis Date  . Heart murmur   . HTN (hypertension) 04/02/2017  . Left inguinal hernia  01/06/2013   Past Surgical History:  Procedure Laterality Date  . FINGER FRACTURE SURGERY Left    thumb   Social History   Tobacco Use  . Smoking status: Never Smoker  . Smokeless tobacco: Never Used  Substance Use Topics  . Alcohol use: Yes    Alcohol/week: 1.0 - 2.0 standard drinks    Types: 1 - 2 Standard drinks or equivalent per week   family history includes Cancer in his mother; Hypertension in his father.  Medications: Current Outpatient Medications  Medication Sig Dispense Refill  . mupirocin ointment (BACTROBAN) 2 % Apply to affected area BID for 7 days for  skin infection 30 g 3  . triamcinolone cream (KENALOG) 0.5 % Apply 1 application topically 2 (two) times daily. For poison ivy to affected areas. 60 g 3   No current facility-administered medications for this visit.    No Known Allergies   Discussed warning signs or symptoms. Please see discharge instructions. Patient expresses understanding.

## 2018-04-01 ENCOUNTER — Encounter: Payer: 59 | Admitting: Family Medicine

## 2018-05-13 ENCOUNTER — Encounter: Payer: 59 | Admitting: Family Medicine

## 2018-09-16 ENCOUNTER — Ambulatory Visit: Payer: 59 | Admitting: Family Medicine

## 2018-09-23 ENCOUNTER — Ambulatory Visit (INDEPENDENT_AMBULATORY_CARE_PROVIDER_SITE_OTHER): Payer: 59 | Admitting: Family Medicine

## 2018-09-23 ENCOUNTER — Encounter: Payer: Self-pay | Admitting: Family Medicine

## 2018-09-23 ENCOUNTER — Telehealth: Payer: 59 | Admitting: Family

## 2018-09-23 VITALS — BP 150/80 | Wt 225.0 lb

## 2018-09-23 DIAGNOSIS — L219 Seborrheic dermatitis, unspecified: Secondary | ICD-10-CM

## 2018-09-23 DIAGNOSIS — I10 Essential (primary) hypertension: Secondary | ICD-10-CM | POA: Diagnosis not present

## 2018-09-23 DIAGNOSIS — J302 Other seasonal allergic rhinitis: Secondary | ICD-10-CM

## 2018-09-23 MED ORDER — DOXYCYCLINE HYCLATE 100 MG PO TABS: 100.0000 mg | ORAL_TABLET | Freq: Two times a day (BID) | ORAL | 0 refills | Status: DC

## 2018-09-23 MED ORDER — KETOCONAZOLE 2 % EX CREA: 1.0000 "application " | TOPICAL_CREAM | Freq: Two times a day (BID) | CUTANEOUS | 3 refills | Status: DC

## 2018-09-23 MED ORDER — FLUTICASONE PROPIONATE 50 MCG/ACT NA SUSP: 2.0000 | Freq: Every day | NASAL | 0 refills | Status: DC

## 2018-09-23 NOTE — Progress Notes (Addendum)
Virtual Visit  via Video or Phone Note  I connected with      Tyler Moody  by a telemedicine application and verified that I am speaking with the correct person using two identifiers.   I discussed the limitations of evaluation and management by telemedicine and the availability of in person appointments. The patient expressed understanding and agreed to proceed.  History of Present Illness: Tyler Moody is a 48 y.o. male who would like to discuss crusted eyelid rash.  Tyler Moody notes that his right lower eyelid became irritated and itchy last night.  He notes a little bit of serous discharge and crust.  He denies any fevers or chills nausea vomiting or diarrhea.  He notes is not particularly painful.  He notes that in 2015 he had a similar symptom that worsened and eventually developed into preseptal cellulitis.  Additionally he has a history of hypertension.  He was last assessed for this in September and elected for trial of lifestyle change.  Unfortunately he is not really been able to do much and his blood pressure still elevated.  His blood pressure was 150/80 when measured at home today.  He would like to try lifestyle change again and would like to avoid taking blood pressure medications if possible.   Observations/Objective: BP (!) 150/80   Wt 225 lb (102.1 kg)   BMI 30.94 kg/m  Wt Readings from Last 5 Encounters:  09/23/18 225 lb (102.1 kg)  03/14/18 221 lb (100.2 kg)  12/07/17 209 lb (94.8 kg)  10/01/17 213 lb (96.6 kg)  04/02/17 224 lb (101.6 kg)   Exam: Normal Speech.  Normal mood thought process and verbal affect.  No shortness of breath stridor wheeze or hoarseness. Skin: As pictured below     Lab and Radiology Results No results found for this or any previous visit (from the past 72 hour(s)). No results found.   Assessment and Plan: 48 y.o. male with  Right eye lid dermatitis.  Likely due to seasonal allergies.  This may also be seborrheic dermatitis.   Plan to treat with oral antihistamine such as Zyrtec.  Additionally use sparingly hydrocortisone cream and will use ketoconazole cream.  Backup doxycycline antibiotics sent to use if worsening into symptoms were suggestive of preseptal cellulitis.   Hypertension: Blood pressure remains elevated.  Patient was not able to do much lifestyle change.  We had a discussion regarding blood pressure management.  He like to retry lifestyle change again and will recheck in 1 month.  If not improved would consider antihypertensive such as lisinopril or hydrochlorothiazide.  Conducted next visit via WebEx.    No orders of the defined types were placed in this encounter.  Meds ordered this encounter  Medications  . ketoconazole (NIZORAL) 2 % cream    Sig: Apply 1 application topically 2 (two) times daily. To affected areas.    Dispense:  60 g    Refill:  3  . doxycycline (VIBRA-TABS) 100 MG tablet    Sig: Take 1 tablet (100 mg total) by mouth 2 (two) times daily. Take if eye worsens    Dispense:  14 tablet    Refill:  0    Follow Up Instructions:    I discussed the assessment and treatment plan with the patient. The patient was provided an opportunity to ask questions and all were answered. The patient agreed with the plan and demonstrated an understanding of the instructions.   The patient was advised to call  back or seek an in-person evaluation if the symptoms worsen or if the condition fails to improve as anticipated.  I provided 21 minutes of non-face-to-face time during this encounter.    Historical information moved to improve visibility of documentation.  Past Medical History:  Diagnosis Date  . Heart murmur   . HTN (hypertension) 04/02/2017  . Left inguinal hernia 01/06/2013   Past Surgical History:  Procedure Laterality Date  . FINGER FRACTURE SURGERY Left    thumb   Social History   Tobacco Use  . Smoking status: Never Smoker  . Smokeless tobacco: Never Used  Substance Use  Topics  . Alcohol use: Yes    Alcohol/week: 1.0 - 2.0 standard drinks    Types: 1 - 2 Standard drinks or equivalent per week   family history includes Cancer in his mother; Hypertension in his father.  Medications: Current Outpatient Medications  Medication Sig Dispense Refill  . doxycycline (VIBRA-TABS) 100 MG tablet Take 1 tablet (100 mg total) by mouth 2 (two) times daily. Take if eye worsens 14 tablet 0  . fluticasone (FLONASE) 50 MCG/ACT nasal spray Place 2 sprays into both nostrils daily. 16 g 0  . ketoconazole (NIZORAL) 2 % cream Apply 1 application topically 2 (two) times daily. To affected areas. 60 g 3  . mupirocin ointment (BACTROBAN) 2 % Apply to affected area BID for 7 days for skin infection 30 g 3  . triamcinolone cream (KENALOG) 0.5 % Apply 1 application topically 2 (two) times daily. For poison ivy to affected areas. 60 g 3   No current facility-administered medications for this visit.    No Known Allergies  PDMP not reviewed this encounter. No orders of the defined types were placed in this encounter.  Meds ordered this encounter  Medications  . ketoconazole (NIZORAL) 2 % cream    Sig: Apply 1 application topically 2 (two) times daily. To affected areas.    Dispense:  60 g    Refill:  3  . doxycycline (VIBRA-TABS) 100 MG tablet    Sig: Take 1 tablet (100 mg total) by mouth 2 (two) times daily. Take if eye worsens    Dispense:  14 tablet    Refill:  0                                                                  Addendum to correct incorrect date due to templating error

## 2018-09-23 NOTE — Patient Instructions (Addendum)
Thank you for coming in today.  Use over-the-counter Zyrtec Claritin or Allegra.  Use over-the-counter hydrocortisone cream lightly on the rash.  Use prescription ketoconazole cream on the rash twice daily.  If worsening start taking the doxycycline antibiotics.  Work on blood pressure lowering including weight loss and reduced salt.  Recheck as scheduled in April.  Seborrheic Dermatitis, Adult Seborrheic dermatitis is a skin disease that causes red, scaly patches. It usually occurs on the scalp, and it is often called dandruff. The patches may appear on other parts of the body. Skin patches tend to appear where there are many oil glands in the skin. Areas of the body that are commonly affected include:  Scalp.  Skin folds of the body.  Ears.  Eyebrows.  Neck.  Face.  Armpits.  The bearded area of men's faces. The condition may come and go for no known reason, and it is often long-lasting (chronic). What are the causes? The cause of this condition is not known. What increases the risk? This condition is more likely to develop in people who:  Have certain conditions, such as: ? HIV (human immunodeficiency virus). ? AIDS (acquired immunodeficiency syndrome). ? Parkinson disease. ? Mood disorders, such as depression.  Are 51-35 years old. What are the signs or symptoms? Symptoms of this condition include:  Thick scales on the scalp.  Redness on the face or in the armpits.  Skin that is flaky. The flakes may be white or yellow.  Skin that seems oily or dry but is not helped with moisturizers.  Itching or burning in the affected areas. How is this diagnosed? This condition is diagnosed with a medical history and physical exam. A sample of your skin may be tested (skin biopsy). You may need to see a skin specialist (dermatologist). How is this treated? There is no cure for this condition, but treatment can help to manage the symptoms. You may get treatment to  remove scales, lower the risk of skin infection, and reduce swelling or itching. Treatment may include:  Creams that reduce swelling and irritation (steroids).  Creams that reduce skin yeast.  Medicated shampoo, soaps, moisturizing creams, or ointments.  Medicated moisturizing creams or ointments. Follow these instructions at home:  Apply over-the-counter and prescription medicines only as told by your health care provider.  Use any medicated shampoo, soaps, skin creams, or ointments only as told by your health care provider.  Keep all follow-up visits as told by your health care provider. This is important. Contact a health care provider if:  Your symptoms do not improve with treatment.  Your symptoms get worse.  You have new symptoms. This information is not intended to replace advice given to you by your health care provider. Make sure you discuss any questions you have with your health care provider. Document Released: 06/18/2005 Document Revised: 01/06/2016 Document Reviewed: 10/06/2015 Elsevier Interactive Patient Education  2019 [place] Cellulitis, Adult  Preseptal cellulitis is an infection of the eyelid and the tissues around the eye (periorbital area). The infection causes painful swelling and redness. This condition may also be called periorbital cellulitis. In most cases, the condition can be treated with antibiotic medicine at home. It is important to treat preseptal cellulitis right away so that it does not get worse. If it gets worse, it can spread to the eye socket and eye muscles (orbital cellulitis). Orbital cellulitis is a medical emergency. What are the causes? Preseptal cellulitis is most commonly caused by bacteria. In  rare cases, it can be caused by a virus or fungus. The germs that cause preseptal cellulitis may come from:  A sinus infection that spreads near the eyes.  An injury near the eye, such as a scratch, animal bite, or insect  bite.  A skin rash that becomes infected, such as eczema or poison ivy.  An infected pimple on the eyelid (stye).  Infection after eyelid surgery or injury. What increases the risk? You are more likely to develop this condition if:  You have a weakened disease-fighting system (immune system).  You have a medical condition that raises your risk for sinus infections, such as nasal polyps. What are the signs or symptoms? Symptoms of this condition usually develop suddenly. Symptoms may include:  Eyelids that are red and swollen and feel unusually hot.  Fever.  Difficulty opening the eye.  Headache.  Facial pain. How is this diagnosed? This condition may be diagnosed based on your symptoms, your medical history, and an eye exam. You may have tests, such as:  Blood tests.  CT scan.  MRI. How is this treated? This condition is treated with antibiotic medicines. These may be given by mouth (orally), through an IV, or as a shot. In rare cases, you may need surgery to drain an infected area. Follow these instructions at home: Medicines  If you were prescribed an antibiotic to take at home, take it as told by your health care provider. Do not stop taking the antibiotic even if you start to feel better.  Take over-the-counter and prescription medicines only as told by your health care provider. Eye Care  Do not use eye drops without first getting approval from your health care provider.  Do not touch or rub your eye. If you wear contact lenses, do not wear them until your health care provider approves.  Keep the eye area clean and dry.  Wash the eye area with a clean washcloth, warm water, and baby shampoo or mild soap.  To help relieve discomfort, place a clean washcloth that is wet with warm water over your eye. Leave the washcloth on for a few minutes, then remove it. General instructions  Wash your hands with soap and water often. If soap and water are not available,  use hand sanitizer.  Do not use any products that contain nicotine or tobacco, such as cigarettes and e-cigarettes. If you need help quitting, ask your health care provider.  Drink enough fluid to keep your urine pale yellow.  Ask your health care provider if it is safe for you to drive.  Stay up to date on your vaccinations.  Keep all follow-up visits as told by your health care provider. This includes any visits with an eye specialist (ophthalmologist) or dentist. This is important. Get help right away if:  You have new symptoms.  Your symptoms get worse or do not get better with treatment.  You have a fever.  Your vision becomes blurry or gets worse in any way.  Your eye looks like it is sticking out or bulging out (proptosis).  You have trouble moving your eyes.  You have a severe headache.  You have neck stiffness or severe neck pain. Summary  Preseptal cellulitis is an infection of the eyelid and the tissues around the eye.  Symptoms of preseptal cellulitis usually develop suddenly and include red and swollen eyelids, fever, difficulty opening the eye, headache, and facial pain.  This condition is treated with antibiotic medicines. Do not stop taking the  antibiotic even if you start to feel better. This information is not intended to replace advice given to you by your health care provider. Make sure you discuss any questions you have with your health care provider. Document Released: 07/21/2010 Document Revised: 04/10/2017 Document Reviewed: 04/10/2017 Elsevier Interactive Patient Education  2019 [place] Eating Plan DASH stands for "Dietary Approaches to Stop Hypertension." The DASH eating plan is a healthy eating plan that has been shown to reduce high blood pressure (hypertension). It may also reduce your risk for type 2 diabetes, heart disease, and stroke. The DASH eating plan may also help with weight loss. What are tips for following this  plan?  General guidelines  Avoid eating more than 2,300 mg (milligrams) of salt (sodium) a day. If you have hypertension, you may need to reduce your sodium intake to 1,500 mg a day.  Limit alcohol intake to no more than 1 drink a day for nonpregnant women and 2 drinks a day for men. One drink equals 12 oz of beer, 5 oz of wine, or 1 oz of hard liquor.  Work with your health care provider to maintain a healthy body weight or to lose weight. Ask what an ideal weight is for you.  Get at least 30 minutes of exercise that causes your heart to beat faster (aerobic exercise) most days of the week. Activities may include walking, swimming, or biking.  Work with your health care provider or diet and nutrition specialist (dietitian) to adjust your eating plan to your individual calorie needs. Reading food labels   Check food labels for the amount of sodium per serving. Choose foods with less than 5 percent of the Daily Value of sodium. Generally, foods with less than 300 mg of sodium per serving fit into this eating plan.  To find whole grains, look for the word "whole" as the first word in the ingredient list. Shopping  Buy products labeled as "low-sodium" or "no salt added."  Buy fresh foods. Avoid canned foods and premade or frozen meals. Cooking  Avoid adding salt when cooking. Use salt-free seasonings or herbs instead of table salt or sea salt. Check with your health care provider or pharmacist before using salt substitutes.  Do not fry foods. Cook foods using healthy methods such as baking, boiling, grilling, and broiling instead.  Cook with heart-healthy oils, such as olive, canola, soybean, or sunflower oil. Meal planning  Eat a balanced diet that includes: ? 5 or more servings of fruits and vegetables each day. At each meal, try to fill half of your plate with fruits and vegetables. ? Up to 6-8 servings of whole grains each day. ? Less than 6 oz of lean meat, poultry, or fish  each day. A 3-oz serving of meat is about the same size as a deck of cards. One egg equals 1 oz. ? 2 servings of low-fat dairy each day. ? A serving of nuts, seeds, or beans 5 times each week. ? Heart-healthy fats. Healthy fats called Omega-3 fatty acids are found in foods such as flaxseeds and coldwater fish, like sardines, salmon, and mackerel.  Limit how much you eat of the following: ? Canned or prepackaged foods. ? Food that is high in trans fat, such as fried foods. ? Food that is high in saturated fat, such as fatty meat. ? Sweets, desserts, sugary drinks, and other foods with added sugar. ? Full-fat dairy products.  Do not salt foods before eating.  Try to eat  at least 2 vegetarian meals each week.  Eat more home-cooked food and less restaurant, buffet, and fast food.  When eating at a restaurant, ask that your food be prepared with less salt or no salt, if possible. What foods are recommended? The items listed may not be a complete list. Talk with your dietitian about what dietary choices are best for you. Grains Whole-grain or whole-wheat bread. Whole-grain or whole-wheat pasta. Brown rice. [place]. Bulgur. Whole-grain and low-sodium cereals. Pita bread. Low-fat, low-sodium crackers. Whole-wheat flour tortillas. Vegetables Fresh or frozen vegetables (raw, steamed, roasted, or grilled). Low-sodium or reduced-sodium tomato and vegetable juice. Low-sodium or reduced-sodium tomato sauce and tomato paste. Low-sodium or reduced-sodium canned vegetables. Fruits All fresh, dried, or frozen fruit. Canned fruit in natural juice (without added sugar). Meat and other protein foods Skinless chicken or [place]. Ground chicken or [place]. Pork with fat trimmed off. Fish and seafood. Egg whites. Dried beans, peas, or lentils. Unsalted nuts, nut butters, and seeds. Unsalted canned beans. Lean cuts of beef with fat trimmed off. Low-sodium, lean deli meat. Dairy Low-fat (1%) or fat-free  (skim) milk. Fat-free, low-fat, or reduced-fat cheeses. Nonfat, low-sodium ricotta or cottage cheese. Low-fat or nonfat yogurt. Low-fat, low-sodium cheese. Fats and oils Soft margarine without trans fats. Vegetable oil. Low-fat, reduced-fat, or light mayonnaise and salad dressings (reduced-sodium). Canola, safflower, olive, soybean, and sunflower oils. Avocado. Seasoning and other foods Herbs. Spices. Seasoning mixes without salt. Unsalted popcorn and pretzels. Fat-free sweets. What foods are not recommended? The items listed may not be a complete list. Talk with your dietitian about what dietary choices are best for you. Grains Baked goods made with fat, such as croissants, muffins, or some breads. Dry pasta or rice meal packs. Vegetables Creamed or fried vegetables. Vegetables in a cheese sauce. Regular canned vegetables (not low-sodium or reduced-sodium). Regular canned tomato sauce and paste (not low-sodium or reduced-sodium). Regular tomato and vegetable juice (not low-sodium or reduced-sodium). Angie Fava. Olives. Fruits Canned fruit in a light or heavy syrup. Fried fruit. Fruit in cream or butter sauce. Meat and other protein foods Fatty cuts of meat. Ribs. Fried meat. Berniece Salines. Sausage. Bologna and other processed lunch meats. Salami. Fatback. Hotdogs. Bratwurst. Salted nuts and seeds. Canned beans with added salt. Canned or smoked fish. Whole eggs or egg yolks. Chicken or [place] with skin. Dairy Whole or 2% milk, cream, and half-and-half. Whole or full-fat cream cheese. Whole-fat or sweetened yogurt. Full-fat cheese. Nondairy creamers. Whipped toppings. Processed cheese and cheese spreads. Fats and oils Butter. Stick margarine. Lard. Shortening. Ghee. Bacon fat. Tropical oils, such as coconut, palm kernel, or palm oil. Seasoning and other foods Salted popcorn and pretzels. Onion salt, garlic salt, seasoned salt, table salt, and sea salt. Worcestershire sauce. Tartar sauce. Barbecue sauce.  Teriyaki sauce. Soy sauce, including reduced-sodium. Steak sauce. Canned and packaged gravies. Fish sauce. Oyster sauce. Cocktail sauce. Horseradish that you find on the shelf. Ketchup. Mustard. Meat flavorings and tenderizers. Bouillon cubes. Hot sauce and Tabasco sauce. Premade or packaged marinades. Premade or packaged taco seasonings. Relishes. Regular salad dressings. Where to find more information:  National Heart, Lung, and [place]: https://wilson-eaton.com/  American Heart Association: www.heart.org Summary  The DASH eating plan is a healthy eating plan that has been shown to reduce high blood pressure (hypertension). It may also reduce your risk for type 2 diabetes, heart disease, and stroke.  With the DASH eating plan, you should limit salt (sodium) intake to 2,300 mg a day. If you have hypertension,  you may need to reduce your sodium intake to 1,500 mg a day.  When on the DASH eating plan, aim to eat more fresh fruits and vegetables, whole grains, lean proteins, low-fat dairy, and heart-healthy fats.  Work with your health care provider or diet and nutrition specialist (dietitian) to adjust your eating plan to your individual calorie needs. This information is not intended to replace advice given to you by your health care provider. Make sure you discuss any questions you have with your health care provider. Document Released: 06/07/2011 Document Revised: 06/11/2016 Document Reviewed: 06/11/2016 Elsevier Interactive Patient Education  2019 [employer].

## 2018-09-23 NOTE — Progress Notes (Signed)
E visit for Allergic Rhinitis We are sorry that you are not feeling well.  Here is how we plan to help!  Based on what you have shared with me it looks like you have Allergic Rhinitis.  Rhinitis is when a reaction occurs that causes nasal congestion, runny nose, sneezing, and itching.  Most types of rhinitis are caused by an inflammation and are associated with symptoms in the eyes ears or throat. There are several types of rhinitis.  The most common are acute rhinitis, which is usually caused by a viral illness, allergic or seasonal rhinitis, and nonallergic or year-round rhinitis.  Nasal allergies occur certain times of the year.  Allergic rhinitis is caused when allergens in the air trigger the release of histamine in the body.  Histamine causes itching, swelling, and fluid to build up in the fragile linings of the nasal passages, sinuses and eyelids.  An itchy nose and clear discharge are common.  I recommend the following over the counter treatments: Xyzal 5 mg take 1 tablet daily  I also would recommend a nasal spray: Flonase 2 sprays into each nostril once daily. I have sent this to the pharmacy  You may also benefit from eye drops such as: Systane 1-2 driops each eye twice daily as needed  HOME CARE:   You can use an over-the-counter saline nasal spray as needed  Avoid areas where there is heavy dust, mites, or molds  Stay indoors on windy days during the pollen season  Keep windows closed in home, at least in bedroom; use air conditioner.  Use high-efficiency house air filter  Keep windows closed in car, turn AC on re-circulate  Avoid playing out with dog during pollen season  GET HELP RIGHT AWAY IF:   If your symptoms do not improve within 10 days  You become short of breath  You develop yellow or green discharge from your nose for over 3 days  You have coughing fits  MAKE SURE YOU:   Understand these instructions  Will watch your condition  Will get help  right away if you are not doing well or get worse  Thank you for choosing an e-visit. Your e-visit answers were reviewed by a board certified advanced clinical practitioner to complete your personal care plan. Depending upon the condition, your plan could have included both over the counter or prescription medications. Please review your pharmacy choice. Be sure that the pharmacy you have chosen is open so that you can pick up your prescription now.  If there is a problem you may message your provider in [place] to have the prescription routed to another pharmacy. Your safety is important to [place]. If you have drug allergies check your prescription carefully.  For the next 24 hours, you can use MyChart to ask questions about today's visit, request a non-urgent call back, or ask for a work or school excuse from your e-visit provider. You will get an email in the next two days asking about your experience. I hope that your e-visit has been valuable and will speed your recovery.

## 2018-10-14 ENCOUNTER — Other Ambulatory Visit: Payer: Self-pay | Admitting: Family

## 2018-10-21 ENCOUNTER — Encounter: Payer: Self-pay | Admitting: Family Medicine

## 2018-10-21 ENCOUNTER — Ambulatory Visit (INDEPENDENT_AMBULATORY_CARE_PROVIDER_SITE_OTHER): Payer: 59 | Admitting: Family Medicine

## 2018-10-21 VITALS — BP 138/70 | HR 67 | Ht 71.0 in | Wt 220.0 lb

## 2018-10-21 DIAGNOSIS — I10 Essential (primary) hypertension: Secondary | ICD-10-CM

## 2018-10-21 NOTE — Progress Notes (Addendum)
Virtual Visit  via Video Note  I connected with      Tyler Moody  by a video enabled telemedicine application and verified that I am speaking with the correct person using two identifiers.   I discussed the limitations of evaluation and management by telemedicine and the availability of in person appointments. The patient expressed understanding and agreed to proceed.  History of Present Illness: Tyler Moody is a 48 y.o. male who would like to discuss hypertension.  Tyler Moody was seen for right eyelid dermatitis thought to be seborrheic dermatitis in March.  His blood pressures during that visit was elevated 150s over 80s.  He had had trials of lifestyle management in the past and wanted to continue lifestyle management if possible.  In the interim he notes that he has managed to decrease his weight by about 5 pounds.  Additionally he has started exercising and reduced coffee and reduced salt.  His blood pressure today is 140/78.  He notes that his blood pressures at home have been in the 130s over 70s most of the time.    He notes the seborrheic dermatitis has completely resolved and is no longer using any of the topical medications. Observations/Objective: BP 140/78   Pulse 67   Ht 5\' 11"  (1.803 m)   Wt 220 lb (99.8 kg)   BMI 30.68 kg/m  Wt Readings from Last 5 Encounters:  10/21/18 220 lb (99.8 kg)  09/23/18 225 lb (102.1 kg)  03/14/18 221 lb (100.2 kg)  12/07/17 209 lb (94.8 kg)  10/01/17 213 lb (96.6 kg)   Exam: Appearance nontoxic no acute distress Normal Speech.  Skin no rash  Lab and Radiology Results No results found for this or any previous visit (from the past 72 hour(s)). No results found.   Assessment and Plan: 48 y.o. male with hypertension: Improved with lifestyle management.  Not at goal but improving.  Continue to work on weight loss low-sodium diet and exercise.  Also emphasized importance of good quality sleep.  Continue serial blood pressure  management and recheck in about 6 months.  Seborrheic dermatitis resolved watchful waiting resume treatment as needed.  PDMP not reviewed this encounter. No orders of the defined types were placed in this encounter.  No orders of the defined types were placed in this encounter.   Follow Up Instructions:    I discussed the assessment and treatment plan with the patient. The patient was provided an opportunity to ask questions and all were answered. The patient agreed with the plan and demonstrated an understanding of the instructions.   The patient was advised to call back or seek an in-person evaluation if the symptoms worsen or if the condition fails to improve as anticipated.  Time: 15 minutes of intraservice time, with >22 minutes of total time during today's visit.      Historical information moved to improve visibility of documentation.  Past Medical History:  Diagnosis Date  . Heart murmur   . HTN (hypertension) 04/02/2017  . Left inguinal hernia 01/06/2013   Past Surgical History:  Procedure Laterality Date  . FINGER FRACTURE SURGERY Left    thumb   Social History   Tobacco Use  . Smoking status: Never Smoker  . Smokeless tobacco: Never Used  Substance Use Topics  . Alcohol use: Yes    Alcohol/week: 1.0 - 2.0 standard drinks    Types: 1 - 2 Standard drinks or equivalent per week   family history includes Cancer in  his mother; Hypertension in his father.  Medications: Current Outpatient Medications  Medication Sig Dispense Refill  . doxycycline (VIBRA-TABS) 100 MG tablet Take 1 tablet (100 mg total) by mouth 2 (two) times daily. Take if eye worsens (Patient not taking: Reported on 10/21/2018) 14 tablet 0  . fluticasone (FLONASE) 50 MCG/ACT nasal spray Place 2 sprays into both nostrils daily. (Patient not taking: Reported on 10/21/2018) 16 g 0  . ketoconazole (NIZORAL) 2 % cream Apply 1 application topically 2 (two) times daily. To affected areas. (Patient not  taking: Reported on 10/21/2018) 60 g 3  . mupirocin ointment (BACTROBAN) 2 % Apply to affected area BID for 7 days for skin infection (Patient not taking: Reported on 10/21/2018) 30 g 3  . triamcinolone cream (KENALOG) 0.5 % Apply 1 application topically 2 (two) times daily. For poison ivy to affected areas. (Patient not taking: Reported on 10/21/2018) 60 g 3   No current facility-administered medications for this visit.    No Known Allergies Addendum to correct date error due to templating issue

## 2018-10-21 NOTE — Patient Instructions (Signed)
Thank you for coming in today. Continue to work on weight and reduced salt.  Follow blood pressure.  We should consider recheck in about 6 months.  Let me know if things change.    DASH Eating Plan DASH stands for "Dietary Approaches to Stop Hypertension." The DASH eating plan is a healthy eating plan that has been shown to reduce high blood pressure (hypertension). It may also reduce your risk for type 2 diabetes, heart disease, and stroke. The DASH eating plan may also help with weight loss. What are tips for following this plan?  General guidelines  Avoid eating more than 2,300 mg (milligrams) of salt (sodium) a day. If you have hypertension, you may need to reduce your sodium intake to 1,500 mg a day.  Limit alcohol intake to no more than 1 drink a day for nonpregnant women and 2 drinks a day for men. One drink equals 12 oz of beer, 5 oz of wine, or 1 oz of hard liquor.  Work with your health care provider to maintain a healthy body weight or to lose weight. Ask what an ideal weight is for you.  Get at least 30 minutes of exercise that causes your heart to beat faster (aerobic exercise) most days of the week. Activities may include walking, swimming, or biking.  Work with your health care provider or diet and nutrition specialist (dietitian) to adjust your eating plan to your individual calorie needs. Reading food labels   Check food labels for the amount of sodium per serving. Choose foods with less than 5 percent of the Daily Value of sodium. Generally, foods with less than 300 mg of sodium per serving fit into this eating plan.  To find whole grains, look for the word "whole" as the first word in the ingredient list. Shopping  Buy products labeled as "low-sodium" or "no salt added."  Buy fresh foods. Avoid canned foods and premade or frozen meals. Cooking  Avoid adding salt when cooking. Use salt-free seasonings or herbs instead of table salt or sea salt. Check with your  health care provider or pharmacist before using salt substitutes.  Do not fry foods. Cook foods using healthy methods such as baking, boiling, grilling, and broiling instead.  Cook with heart-healthy oils, such as olive, canola, soybean, or sunflower oil. Meal planning  Eat a balanced diet that includes: ? 5 or more servings of fruits and vegetables each day. At each meal, try to fill half of your plate with fruits and vegetables. ? Up to 6-8 servings of whole grains each day. ? Less than 6 oz of lean meat, poultry, or fish each day. A 3-oz serving of meat is about the same size as a deck of cards. One egg equals 1 oz. ? 2 servings of low-fat dairy each day. ? A serving of nuts, seeds, or beans 5 times each week. ? Heart-healthy fats. Healthy fats called Omega-3 fatty acids are found in foods such as flaxseeds and coldwater fish, like sardines, salmon, and mackerel.  Limit how much you eat of the following: ? Canned or prepackaged foods. ? Food that is high in trans fat, such as fried foods. ? Food that is high in saturated fat, such as fatty meat. ? Sweets, desserts, sugary drinks, and other foods with added sugar. ? Full-fat dairy products.  Do not salt foods before eating.  Try to eat at least 2 vegetarian meals each week.  Eat more home-cooked food and less restaurant, buffet, and fast food.  When eating at a restaurant, ask that your food be prepared with less salt or no salt, if possible. What foods are recommended? The items listed may not be a complete list. Talk with your dietitian about what dietary choices are best for you. Grains Whole-grain or whole-wheat bread. Whole-grain or whole-wheat pasta. Brown rice. [place]. Bulgur. Whole-grain and low-sodium cereals. Pita bread. Low-fat, low-sodium crackers. Whole-wheat flour tortillas. Vegetables Fresh or frozen vegetables (raw, steamed, roasted, or grilled). Low-sodium or reduced-sodium tomato and vegetable juice.  Low-sodium or reduced-sodium tomato sauce and tomato paste. Low-sodium or reduced-sodium canned vegetables. Fruits All fresh, dried, or frozen fruit. Canned fruit in natural juice (without added sugar). Meat and other protein foods Skinless chicken or [place]. Ground chicken or [place]. Pork with fat trimmed off. Fish and seafood. Egg whites. Dried beans, peas, or lentils. Unsalted nuts, nut butters, and seeds. Unsalted canned beans. Lean cuts of beef with fat trimmed off. Low-sodium, lean deli meat. Dairy Low-fat (1%) or fat-free (skim) milk. Fat-free, low-fat, or reduced-fat cheeses. Nonfat, low-sodium ricotta or cottage cheese. Low-fat or nonfat yogurt. Low-fat, low-sodium cheese. Fats and oils Soft margarine without trans fats. Vegetable oil. Low-fat, reduced-fat, or light mayonnaise and salad dressings (reduced-sodium). Canola, safflower, olive, soybean, and sunflower oils. Avocado. Seasoning and other foods Herbs. Spices. Seasoning mixes without salt. Unsalted popcorn and pretzels. Fat-free sweets. What foods are not recommended? The items listed may not be a complete list. Talk with your dietitian about what dietary choices are best for you. Grains Baked goods made with fat, such as croissants, muffins, or some breads. Dry pasta or rice meal packs. Vegetables Creamed or fried vegetables. Vegetables in a cheese sauce. Regular canned vegetables (not low-sodium or reduced-sodium). Regular canned tomato sauce and paste (not low-sodium or reduced-sodium). Regular tomato and vegetable juice (not low-sodium or reduced-sodium). Angie Fava. Olives. Fruits Canned fruit in a light or heavy syrup. Fried fruit. Fruit in cream or butter sauce. Meat and other protein foods Fatty cuts of meat. Ribs. Fried meat. Berniece Salines. Sausage. Bologna and other processed lunch meats. Salami. Fatback. Hotdogs. Bratwurst. Salted nuts and seeds. Canned beans with added salt. Canned or smoked fish. Whole eggs or egg yolks. Chicken  or [place] with skin. Dairy Whole or 2% milk, cream, and half-and-half. Whole or full-fat cream cheese. Whole-fat or sweetened yogurt. Full-fat cheese. Nondairy creamers. Whipped toppings. Processed cheese and cheese spreads. Fats and oils Butter. Stick margarine. Lard. Shortening. Ghee. Bacon fat. Tropical oils, such as coconut, palm kernel, or palm oil. Seasoning and other foods Salted popcorn and pretzels. Onion salt, garlic salt, seasoned salt, table salt, and sea salt. Worcestershire sauce. Tartar sauce. Barbecue sauce. Teriyaki sauce. Soy sauce, including reduced-sodium. Steak sauce. Canned and packaged gravies. Fish sauce. Oyster sauce. Cocktail sauce. Horseradish that you find on the shelf. Ketchup. Mustard. Meat flavorings and tenderizers. Bouillon cubes. Hot sauce and Tabasco sauce. Premade or packaged marinades. Premade or packaged taco seasonings. Relishes. Regular salad dressings. Where to find more information:  National Heart, Lung, and [place]: https://wilson-eaton.com/  American Heart Association: www.heart.org Summary  The DASH eating plan is a healthy eating plan that has been shown to reduce high blood pressure (hypertension). It may also reduce your risk for type 2 diabetes, heart disease, and stroke.  With the DASH eating plan, you should limit salt (sodium) intake to 2,300 mg a day. If you have hypertension, you may need to reduce your sodium intake to 1,500 mg a day.  When on the DASH eating plan,  aim to eat more fresh fruits and vegetables, whole grains, lean proteins, low-fat dairy, and heart-healthy fats.  Work with your health care provider or diet and nutrition specialist (dietitian) to adjust your eating plan to your individual calorie needs. This information is not intended to replace advice given to you by your health care provider. Make sure you discuss any questions you have with your health care provider. Document Released: 06/07/2011 Document Revised:  06/11/2016 Document Reviewed: 06/11/2016 Elsevier Interactive Patient Education  2019 [employer].

## 2018-10-28 MED FILL — AMOXICILLIN 500 MG CAPSULE: 500 | 7 days supply | Qty: 21 | Fill #0

## 2018-11-05 MED FILL — CLINDAMYCIN HCL 150 MG CAPS: 150 | 1 days supply | Qty: 4 | Fill #0

## 2018-11-06 MED FILL — HYDROCODON-APAP 10-325: 10-325 | 3 days supply | Qty: 12 | Fill #0

## 2018-11-06 MED FILL — AMOXICILLIN 500 MG CAPSULE: 500 | 5 days supply | Qty: 15 | Fill #0

## 2019-08-03 ENCOUNTER — Ambulatory Visit (INDEPENDENT_AMBULATORY_CARE_PROVIDER_SITE_OTHER): Payer: 59 | Admitting: Family Medicine

## 2019-08-03 ENCOUNTER — Other Ambulatory Visit: Payer: Self-pay

## 2019-08-03 ENCOUNTER — Encounter: Payer: Self-pay | Admitting: Family Medicine

## 2019-08-03 VITALS — BP 137/98 | HR 66 | Wt 234.0 lb

## 2019-08-03 DIAGNOSIS — Z13220 Encounter for screening for lipoid disorders: Secondary | ICD-10-CM | POA: Diagnosis not present

## 2019-08-03 DIAGNOSIS — Z0000 Encounter for general adult medical examination without abnormal findings: Secondary | ICD-10-CM | POA: Diagnosis not present

## 2019-08-03 DIAGNOSIS — I10 Essential (primary) hypertension: Secondary | ICD-10-CM | POA: Diagnosis not present

## 2019-08-03 NOTE — Patient Instructions (Signed)
Preventive Care 41-50 Years Old, Male Preventive care refers to lifestyle choices and visits with your health care provider that can promote health and wellness. This includes:  A yearly physical exam. This is also called an annual well check.  Regular dental and eye exams.  Immunizations.  Screening for certain conditions.  Healthy lifestyle choices, such as eating a healthy diet, getting regular exercise, not using drugs or products that contain nicotine and tobacco, and limiting alcohol use. What can I expect for my preventive care visit? Physical exam Your health care provider will check:  Height and weight. These may be used to calculate body mass index (BMI), which is a measurement that tells if you are at a healthy weight.  Heart rate and blood pressure.  Your skin for abnormal spots. Counseling Your health care provider may ask you questions about:  Alcohol, tobacco, and drug use.  Emotional well-being.  Home and relationship well-being.  Sexual activity.  Eating habits.  Work and work Statistician. What immunizations do I need?  Influenza (flu) vaccine  This is recommended every year. Tetanus, diphtheria, and pertussis (Tdap) vaccine  You may need a Td booster every 10 years. Varicella (chickenpox) vaccine  You may need this vaccine if you have not already been vaccinated. Zoster (shingles) vaccine  You may need this after age 64. Measles, mumps, and rubella (MMR) vaccine  You may need at least one dose of MMR if you were born in 1957 or later. You may also need a second dose. Pneumococcal conjugate (PCV13) vaccine  You may need this if you have certain conditions and were not previously vaccinated. Pneumococcal polysaccharide (PPSV23) vaccine  You may need one or two doses if you smoke cigarettes or if you have certain conditions. Meningococcal conjugate (MenACWY) vaccine  You may need this if you have certain conditions. Hepatitis A  vaccine  You may need this if you have certain conditions or if you travel or work in places where you may be exposed to hepatitis A. Hepatitis B vaccine  You may need this if you have certain conditions or if you travel or work in places where you may be exposed to hepatitis B. Haemophilus influenzae type b (Hib) vaccine  You may need this if you have certain risk factors. Human papillomavirus (HPV) vaccine  If recommended by your health care provider, you may need three doses over 6 months. You may receive vaccines as individual doses or as more than one vaccine together in one shot (combination vaccines). Talk with your health care provider about the risks and benefits of combination vaccines. What tests do I need? Blood tests  Lipid and cholesterol levels. These may be checked every 5 years, or more frequently if you are over 60 years old.  Hepatitis C test.  Hepatitis B test. Screening  Lung cancer screening. You may have this screening every year starting at age 43 if you have a 30-pack-year history of smoking and currently smoke or have quit within the past 15 years.  Prostate cancer screening. Recommendations will vary depending on your family history and other risks.  Colorectal cancer screening. All adults should have this screening starting at age 72 and continuing until age 2. Your health care provider may recommend screening at age 14 if you are at increased risk. You will have tests every 1-10 years, depending on your results and the type of screening test.  Diabetes screening. This is done by checking your blood sugar (glucose) after you have not eaten  for a while (fasting). You may have this done every 1-3 years.  Sexually transmitted disease (STD) testing. Follow these instructions at home: Eating and drinking  Eat a diet that includes fresh fruits and vegetables, whole grains, lean protein, and low-fat dairy products.  Take vitamin and mineral supplements as  recommended by your health care provider.  Do not drink alcohol if your health care provider tells you not to drink.  If you drink alcohol: ? Limit how much you have to 0-2 drinks a day. ? Be aware of how much alcohol is in your drink. In the [place] one drink equals one 12 oz bottle of beer (355 mL), one 5 oz glass of wine (148 mL), or one 1 oz glass of hard liquor (44 mL). Lifestyle  Take daily care of your teeth and gums.  Stay active. Exercise for at least 30 minutes on 5 or more days each week.  Do not use any products that contain nicotine or tobacco, such as cigarettes, e-cigarettes, and chewing tobacco. If you need help quitting, ask your health care provider.  If you are sexually active, practice safe sex. Use a condom or other form of protection to prevent STIs (sexually transmitted infections).  Talk with your health care provider about taking a low-dose aspirin every day starting at age 53. What's next?  Go to your health care provider once a year for a well check visit.  Ask your health care provider how often you should have your eyes and teeth checked.  Stay up to date on all vaccines. This information is not intended to replace advice given to you by your health care provider. Make sure you discuss any questions you have with your health care provider. Document Revised: 06/12/2018 Document Reviewed: 06/12/2018 Elsevier Patient Education  2020 [employer].

## 2019-08-03 NOTE — Assessment & Plan Note (Signed)
Diastolic pressure mildly elevated this morning.  Encouraged to follow low salt diet and work on increasing exercise for weight loss.  He will continue to monitor BP at home.

## 2019-08-03 NOTE — Progress Notes (Signed)
Tyler Moody - 49 y.o. male MRN [id number]  Date of birth: 1970-08-08  Subjective Chief Complaint  Patient presents with  . Annual Exam    meet new MD    HPI Tyler Moody is a 49 y.o. male with history of HTN here today for annual exam.  He reports that he is doing well.  He has no new concerns today.  He denies any new changes to his health.  HTN is well controlled with lifestyle change. He recently purchased a stationary bike with plans to exercise more often.  He would like to avoid medication.  BP at home has been well controlled.  He is a non-smoker and consumes EtOH occasionally.   Review of Systems  Constitutional: Negative for chills, fever, malaise/fatigue and weight loss.  HENT: Negative for congestion, ear pain and sore throat.   Eyes: Negative for blurred vision, double vision and pain.  Respiratory: Negative for cough and shortness of breath.   Cardiovascular: Negative for chest pain and palpitations.  Gastrointestinal: Negative for abdominal pain, blood in stool, constipation, heartburn and nausea.  Genitourinary: Negative for dysuria and urgency.  Musculoskeletal: Negative for joint pain and myalgias.  Neurological: Negative for dizziness and headaches.  Endo/Heme/Allergies: Does not bruise/bleed easily.  Psychiatric/Behavioral: Negative for depression. The patient is not nervous/anxious and does not have insomnia.       No Known Allergies  Past Medical History:  Diagnosis Date  . Heart murmur   . HTN (hypertension) 04/02/2017  . Left inguinal hernia 01/06/2013    Past Surgical History:  Procedure Laterality Date  . FINGER FRACTURE SURGERY Left    thumb    Social History   Socioeconomic History  . Marital status: Married    Spouse name: Not on file  . Number of children: Not on file  . Years of education: Not on file  . Highest education level: Not on file  Occupational History  . Not on file  Tobacco Use  . Smoking status: Never Smoker  .  Smokeless tobacco: Never Used  Substance and Sexual Activity  . Alcohol use: Yes    Alcohol/week: 1.0 - 2.0 standard drinks    Types: 1 - 2 Standard drinks or equivalent per week  . Drug use: No  . Sexual activity: Yes    Partners: Female  Other Topics Concern  . Not on file  Social History Narrative  . Not on file   Social Determinants of Health   Financial Resource Strain:   . Difficulty of Paying Living Expenses: Not on file  Food Insecurity:   . Worried About Charity fundraiser in the Last Year: Not on file  . Ran Out of Food in the Last Year: Not on file  Transportation Needs:   . Lack of Transportation (Medical): Not on file  . Lack of Transportation (Non-Medical): Not on file  Physical Activity:   . Days of Exercise per Week: Not on file  . Minutes of Exercise per Session: Not on file  Stress:   . Feeling of Stress : Not on file  Social Connections:   . Frequency of Communication with Friends and Family: Not on file  . Frequency of Social Gatherings with Friends and Family: Not on file  . Attends Religious Services: Not on file  . Active Member of Clubs or Organizations: Not on file  . Attends Archivist Meetings: Not on file  . Marital Status: Not on file    Family History  Problem Relation Age of Onset  . Cancer Mother        breast CA  . Hypertension Father     Health Maintenance  Topic Date Due  . TETANUS/TDAP  02/22/2024  . INFLUENZA VACCINE  Completed  . HIV Screening  Completed    ----------------------------------------------------------------------------------------------------------------------------------------------------------------------------------------------------------------- Physical Exam BP (!) 137/98   Pulse 66   Wt 234 lb (106.1 kg)   BMI 32.64 kg/m   Physical Exam Constitutional:      General: He is not in acute distress. HENT:     Head: Normocephalic and atraumatic.     Right Ear: External ear normal.     Left  Ear: External ear normal.     Mouth/Throat:     Mouth: Mucous membranes are moist.  Eyes:     General: No scleral icterus. Neck:     Thyroid: No thyromegaly.  Cardiovascular:     Rate and Rhythm: Normal rate and regular rhythm.     Heart sounds: Normal heart sounds.  Pulmonary:     Effort: Pulmonary effort is normal.     Breath sounds: Normal breath sounds.  Abdominal:     General: Bowel sounds are normal. There is no distension.     Palpations: Abdomen is soft.     Tenderness: There is no abdominal tenderness. There is no guarding.  Musculoskeletal:     Cervical back: Normal range of motion.  Lymphadenopathy:     Cervical: No cervical adenopathy.  Skin:    General: Skin is warm and dry.     Findings: No rash.  Neurological:     Mental Status: He is alert and oriented to person, place, and time.     Cranial Nerves: No cranial nerve deficit.     Motor: No abnormal muscle tone.  Psychiatric:        Mood and Affect: Mood normal.        Behavior: Behavior normal.     ------------------------------------------------------------------------------------------------------------------------------------------------------------------------------------------------------------------- Assessment and Plan  HTN (hypertension) Diastolic pressure mildly elevated this morning.  Encouraged to follow low salt diet and work on increasing exercise for weight loss.  He will continue to monitor BP at home.   Well adult exam Well adult Orders Placed This Encounter  Procedures  . COMPLETE METABOLIC PANEL WITH GFR  . CBC  . Lipid Profile  . TSH  Screening: Lipid panel Immunizations: UTD Anticipatory guidance/Risk factor reduction:  Counseled on reduced fat diet high in fruits and vegetables, regular exercise. Additional recommendations per AVS.     This visit occurred during the SARS-CoV-2 public health emergency.  Safety protocols were in place, including screening questions prior to the  visit, additional usage of staff PPE, and extensive cleaning of exam room while observing appropriate contact time as indicated for disinfecting solutions.

## 2019-08-03 NOTE — Assessment & Plan Note (Signed)
Well adult Orders Placed This Encounter  Procedures  . COMPLETE METABOLIC PANEL WITH GFR  . CBC  . Lipid Profile  . TSH  Screening: Lipid panel Immunizations: UTD Anticipatory guidance/Risk factor reduction:  Counseled on reduced fat diet high in fruits and vegetables, regular exercise. Additional recommendations per AVS.

## 2019-08-04 ENCOUNTER — Other Ambulatory Visit: Payer: Self-pay | Admitting: Family Medicine

## 2019-08-04 DIAGNOSIS — R748 Abnormal levels of other serum enzymes: Secondary | ICD-10-CM

## 2019-08-04 LAB — CBC
HCT: 48.7 % (ref 38.5–50.0)
Hemoglobin: 16.9 g/dL (ref 13.2–17.1)
MCH: 31 pg (ref 27.0–33.0)
MCHC: 34.7 g/dL (ref 32.0–36.0)
MCV: 89.4 fL (ref 80.0–100.0)
MPV: 9.7 fL (ref 7.5–12.5)
Platelets: 308 10*3/uL (ref 140–400)
RBC: 5.45 10*6/uL (ref 4.20–5.80)
RDW: 12.3 % (ref 11.0–15.0)
WBC: 6.7 10*3/uL (ref 3.8–10.8)

## 2019-08-04 LAB — LIPID PANEL
Cholesterol: 172 mg/dL (ref <200)
HDL: 38 mg/dL — ABNORMAL LOW (ref >=40)
LDL Cholesterol (Calc): 109 mg/dL (calc) — ABNORMAL HIGH
Non-HDL Cholesterol (Calc): 134 mg/dL (calc) — ABNORMAL HIGH (ref <130)
Total CHOL/HDL Ratio: 4.5 (calc) (ref <5.0)
Triglycerides: 137 mg/dL (ref <150)

## 2019-08-04 LAB — COMPLETE METABOLIC PANEL WITH GFR
AG Ratio: 1.8 (calc) (ref 1.0–2.5)
ALT: 56 U/L — ABNORMAL HIGH (ref 9–46)
AST: 26 U/L (ref 10–40)
Albumin: 4.4 g/dL (ref 3.6–5.1)
Alkaline phosphatase (APISO): 68 U/L (ref 36–130)
BUN: 17 mg/dL (ref 7–25)
CO2: 27 mmol/L (ref 20–32)
Calcium: 9.8 mg/dL (ref 8.6–10.3)
Chloride: 106 mmol/L (ref 98–110)
Creat: 1.11 mg/dL (ref 0.60–1.35)
GFR, Est African American: 90 mL/min/{1.73_m2} (ref >=60)
GFR, Est Non African American: 78 mL/min/{1.73_m2} (ref >=60)
Globulin: 2.5 g/dL (calc) (ref 1.9–3.7)
Glucose, Bld: 110 mg/dL — ABNORMAL HIGH (ref 65–99)
Potassium: 4.5 mmol/L (ref 3.5–5.3)
Sodium: 140 mmol/L (ref 135–146)
Total Bilirubin: 0.5 mg/dL (ref 0.2–1.2)
Total Protein: 6.9 g/dL (ref 6.1–8.1)

## 2019-08-04 LAB — TSH: TSH: 2.03 mIU/L (ref 0.40–4.50)

## 2020-01-21 ENCOUNTER — Encounter: Payer: Self-pay | Admitting: Family Medicine

## 2020-01-25 ENCOUNTER — Telehealth: Payer: 59 | Admitting: Family Medicine

## 2020-02-12 ENCOUNTER — Encounter: Payer: Self-pay | Admitting: Family Medicine

## 2020-08-03 ENCOUNTER — Other Ambulatory Visit: Payer: Self-pay

## 2020-08-03 ENCOUNTER — Encounter: Payer: Self-pay | Admitting: Family Medicine

## 2020-08-03 ENCOUNTER — Ambulatory Visit (INDEPENDENT_AMBULATORY_CARE_PROVIDER_SITE_OTHER): Payer: 59 | Admitting: Family Medicine

## 2020-08-03 VITALS — BP 147/101 | HR 85 | Temp 97.8°F | Wt 237.0 lb

## 2020-08-03 DIAGNOSIS — Z1211 Encounter for screening for malignant neoplasm of colon: Secondary | ICD-10-CM | POA: Diagnosis not present

## 2020-08-03 DIAGNOSIS — Z125 Encounter for screening for malignant neoplasm of prostate: Secondary | ICD-10-CM | POA: Diagnosis not present

## 2020-08-03 DIAGNOSIS — Z13220 Encounter for screening for lipoid disorders: Secondary | ICD-10-CM | POA: Diagnosis not present

## 2020-08-03 DIAGNOSIS — Z0000 Encounter for general adult medical examination without abnormal findings: Secondary | ICD-10-CM | POA: Diagnosis not present

## 2020-08-03 DIAGNOSIS — I10 Essential (primary) hypertension: Secondary | ICD-10-CM

## 2020-08-03 LAB — CBC
HCT: 47.5 % (ref 38.5–50.0)
MCH: 30.9 pg (ref 27.0–33.0)

## 2020-08-03 NOTE — Progress Notes (Signed)
Tyler Moody - 50 y.o. male MRN [id number]  Date of birth: 09-18-1970  Subjective Chief Complaint  Patient presents with  . Annual Exam    HPI Tyler Moody is a 50 y.o. male here today for annual exam.  He has history of HTN but has otherwise been in pretty good health.    BP remains elevated.  He has not made changes to his diet or exercise habits.  He still refuses to start medication.    He is a non-smoker. He has a couple of drinks of EtOH each week.   He has had COVID and flu vaccines.   Review of Systems  Constitutional: Negative for chills, fever, malaise/fatigue and weight loss.  HENT: Negative for congestion, ear pain and sore throat.   Eyes: Negative for blurred vision, double vision and pain.  Respiratory: Negative for cough and shortness of breath.   Cardiovascular: Negative for chest pain and palpitations.  Gastrointestinal: Negative for abdominal pain, blood in stool, constipation, heartburn and nausea.  Genitourinary: Negative for dysuria and urgency.  Musculoskeletal: Negative for joint pain and myalgias.  Neurological: Negative for dizziness and headaches.  Endo/Heme/Allergies: Does not bruise/bleed easily.  Psychiatric/Behavioral: Negative for depression. The patient is not nervous/anxious and does not have insomnia.     No Known Allergies  Past Medical History:  Diagnosis Date  . Heart murmur   . HTN (hypertension) 04/02/2017  . Left inguinal hernia 01/06/2013    Past Surgical History:  Procedure Laterality Date  . FINGER FRACTURE SURGERY Left    thumb    Social History   Socioeconomic History  . Marital status: Married    Spouse name: Not on file  . Number of children: Not on file  . Years of education: Not on file  . Highest education level: Not on file  Occupational History  . Not on file  Tobacco Use  . Smoking status: Never Smoker  . Smokeless tobacco: Never Used  Vaping Use  . Vaping Use: Never used  Substance and Sexual Activity   . Alcohol use: Yes    Alcohol/week: 1.0 - 2.0 standard drink    Types: 1 - 2 Standard drinks or equivalent per week  . Drug use: No  . Sexual activity: Yes    Partners: Female  Other Topics Concern  . Not on file  Social History Narrative  . Not on file   Social Determinants of Health   Financial Resource Strain: Not on file  Food Insecurity: Not on file  Transportation Needs: Not on file  Physical Activity: Not on file  Stress: Not on file  Social Connections: Not on file    Family History  Problem Relation Age of Onset  . Cancer Mother        breast CA  . Hypertension Father     Health Maintenance  Topic Date Due  . Hepatitis C Screening  Never done  . COLONOSCOPY (Pts 45-39yrs Insurance coverage will need to be confirmed)  Never done  . COVID-19 Vaccine (2 - Booster for [employer] series) 08/19/2020 (Originally 04/08/2020)  . TETANUS/TDAP  02/22/2024  . INFLUENZA VACCINE  Completed  . HIV Screening  Completed     ----------------------------------------------------------------------------------------------------------------------------------------------------------------------------------------------------------------- Physical Exam BP (!) 147/101 (BP Location: Left Arm, Patient Position: Sitting, Cuff Size: Large)   Pulse 85   Temp 97.8 F (36.6 C) (Oral)   Wt 237 lb (107.5 kg)   SpO2 95%   BMI 33.05 kg/m   Physical Exam Constitutional:  General: He is not in acute distress.    Appearance: He is well-nourished.  HENT:     Head: Normocephalic and atraumatic.     Right Ear: Tympanic membrane and external ear normal.     Left Ear: Tympanic membrane and external ear normal.     Mouth/Throat:     Mouth: Oropharynx is clear and moist.  Eyes:     General: No scleral icterus. Neck:     Thyroid: No thyromegaly.  Cardiovascular:     Rate and Rhythm: Normal rate and regular rhythm.     Pulses: Intact distal pulses.     Heart sounds: Normal heart sounds.   Pulmonary:     Effort: Pulmonary effort is normal.     Breath sounds: Normal breath sounds.  Abdominal:     General: Bowel sounds are normal. There is no distension.     Palpations: Abdomen is soft.     Tenderness: There is no abdominal tenderness. There is no guarding.  Musculoskeletal:        General: No edema.     Cervical back: Normal range of motion and neck supple.  Lymphadenopathy:     Cervical: No cervical adenopathy.  Skin:    General: Skin is warm and dry.     Findings: No rash.  Neurological:     General: No focal deficit present.     Mental Status: He is alert and oriented to person, place, and time.     Cranial Nerves: No cranial nerve deficit.     Motor: No abnormal muscle tone.  Psychiatric:        Mood and Affect: Mood and affect and mood normal.        Behavior: Behavior normal.     ------------------------------------------------------------------------------------------------------------------------------------------------------------------------------------------------------------------- Assessment and Plan  HTN (hypertension) Refuses medications.  States he will make lifestyle changes.  See me again in 6 months.  Agrees to consider medication if BP remains elevated.  Well adult exam Well adult Orders Placed This Encounter  Procedures  . COMPLETE METABOLIC PANEL WITH GFR  . CBC  . Lipid Profile  . PSA  . Ambulatory referral to Gastroenterology    Referral Priority:   Routine    Referral Type:   Consultation    Referral Reason:   Specialty Services Required    Number of Visits Requested:   1  Screening: Lipid, PSA, refer to colonoscopy Immunizations: UTD Anticipatory guidance/risk factor reduction:  Recommendations per AVS.    No orders of the defined types were placed in this encounter.   Return in about 6 months (around 01/31/2021) for HTN.    This visit occurred during the SARS-CoV-2 public health emergency.  Safety protocols were in  place, including screening questions prior to the visit, additional usage of staff PPE, and extensive cleaning of exam room while observing appropriate contact time as indicated for disinfecting solutions.

## 2020-08-03 NOTE — Assessment & Plan Note (Signed)
Refuses medications.  States he will make lifestyle changes.  See me again in 6 months.  Agrees to consider medication if BP remains elevated.

## 2020-08-03 NOTE — Assessment & Plan Note (Signed)
Well adult Orders Placed This Encounter  Procedures  . COMPLETE METABOLIC PANEL WITH GFR  . CBC  . Lipid Profile  . PSA  . Ambulatory referral to Gastroenterology    Referral Priority:   Routine    Referral Type:   Consultation    Referral Reason:   Specialty Services Required    Number of Visits Requested:   1  Screening: Lipid, PSA, refer to colonoscopy Immunizations: UTD Anticipatory guidance/risk factor reduction:  Recommendations per AVS.

## 2020-08-03 NOTE — Patient Instructions (Signed)

## 2020-08-04 LAB — LIPID PANEL
Cholesterol: 174 mg/dL (ref <200)
HDL: 40 mg/dL (ref >=40)
LDL Cholesterol (Calc): 109 mg/dL (calc) — ABNORMAL HIGH
Non-HDL Cholesterol (Calc): 134 mg/dL (calc) — ABNORMAL HIGH (ref <130)
Total CHOL/HDL Ratio: 4.4 (calc) (ref <5.0)
Triglycerides: 135 mg/dL (ref <150)

## 2020-08-04 LAB — COMPLETE METABOLIC PANEL WITH GFR
AG Ratio: 2.1 (calc) (ref 1.0–2.5)
ALT: 61 U/L — ABNORMAL HIGH (ref 9–46)
AST: 30 U/L (ref 10–35)
Albumin: 4.4 g/dL (ref 3.6–5.1)
Alkaline phosphatase (APISO): 61 U/L (ref 35–144)
BUN: 16 mg/dL (ref 7–25)
CO2: 29 mmol/L (ref 20–32)
Calcium: 9.7 mg/dL (ref 8.6–10.3)
Chloride: 104 mmol/L (ref 98–110)
Creat: 1.03 mg/dL (ref 0.70–1.33)
GFR, Est African American: 98 mL/min/{1.73_m2} (ref >=60)
GFR, Est Non African American: 84 mL/min/{1.73_m2} (ref >=60)
Globulin: 2.1 g/dL (calc) (ref 1.9–3.7)
Glucose, Bld: 108 mg/dL — ABNORMAL HIGH (ref 65–99)
Potassium: 4.2 mmol/L (ref 3.5–5.3)
Sodium: 140 mmol/L (ref 135–146)
Total Bilirubin: 0.7 mg/dL (ref 0.2–1.2)
Total Protein: 6.5 g/dL (ref 6.1–8.1)

## 2020-08-04 LAB — CBC
Hemoglobin: 16.4 g/dL (ref 13.2–17.1)
MCHC: 34.5 g/dL (ref 32.0–36.0)
MCV: 89.6 fL (ref 80.0–100.0)
MPV: 9.3 fL (ref 7.5–12.5)
Platelets: 331 10*3/uL (ref 140–400)
RBC: 5.3 10*6/uL (ref 4.20–5.80)
RDW: 12 % (ref 11.0–15.0)
WBC: 8.1 10*3/uL (ref 3.8–10.8)

## 2020-08-04 LAB — PSA: PSA: 0.7 ng/mL (ref <=4.0)

## 2020-10-17 ENCOUNTER — Encounter: Payer: Self-pay | Admitting: Gastroenterology

## 2020-12-14 ENCOUNTER — Other Ambulatory Visit: Payer: Self-pay

## 2020-12-14 ENCOUNTER — Ambulatory Visit (AMBULATORY_SURGERY_CENTER): Payer: 59 | Admitting: *Deleted

## 2020-12-14 ENCOUNTER — Other Ambulatory Visit (HOSPITAL_COMMUNITY): Payer: Self-pay

## 2020-12-14 ENCOUNTER — Encounter: Payer: Self-pay | Admitting: Gastroenterology

## 2020-12-14 VITALS — Ht 71.0 in | Wt 224.0 lb

## 2020-12-14 DIAGNOSIS — Z1211 Encounter for screening for malignant neoplasm of colon: Secondary | ICD-10-CM

## 2020-12-14 MED ORDER — SUPREP BOWEL PREP KIT 17.5-3.13-1.6 GM/177ML PO SOLN
1.0000 | Freq: Once | ORAL | 0 refills | Status: AC
  Filled 2020-12-14: qty 354, 1d supply, fill #0

## 2020-12-14 NOTE — Progress Notes (Signed)

## 2020-12-28 ENCOUNTER — Ambulatory Visit (AMBULATORY_SURGERY_CENTER): Payer: 59 | Admitting: Gastroenterology

## 2020-12-28 ENCOUNTER — Encounter: Payer: Self-pay | Admitting: Gastroenterology

## 2020-12-28 ENCOUNTER — Other Ambulatory Visit: Payer: Self-pay

## 2020-12-28 VITALS — BP 113/70 | HR 50 | Temp 98.8°F | Resp 20 | Ht 71.0 in | Wt 224.0 lb

## 2020-12-28 DIAGNOSIS — Z1211 Encounter for screening for malignant neoplasm of colon: Secondary | ICD-10-CM | POA: Diagnosis not present

## 2020-12-28 DIAGNOSIS — D127 Benign neoplasm of rectosigmoid junction: Secondary | ICD-10-CM | POA: Diagnosis not present

## 2020-12-28 MED ORDER — SODIUM CHLORIDE 0.9 % IV SOLN: 500.0000 mL | Freq: Once | INTRAVENOUS | Status: DC

## 2020-12-28 NOTE — Progress Notes (Signed)
Report to PACU, RN, vss, BBS= Clear.  

## 2020-12-28 NOTE — Op Note (Signed)
[facility] Patient Name: Tyler Moody Procedure Date: 12/28/2020 1:30 PM MRN: [id number] Endoscopist: Remo Lipps P. Havery Moros , MD Age: 50 Referring MD:  Date of Birth: 04-17-1971 Gender: Male Account #: [id number] Procedure:                Colonoscopy Indications:              Screening for colorectal malignant neoplasm, This                            is the patient's first colonoscopy Medicines:                Monitored Anesthesia Care Procedure:                Pre-Anesthesia Assessment:                           - Prior to the procedure, a History and Physical                            was performed, and patient medications and                            allergies were reviewed. The patient's tolerance of                            previous anesthesia was also reviewed. The risks                            and benefits of the procedure and the sedation                            options and risks were discussed with the patient.                            All questions were answered, and informed consent                            was obtained. Prior Anticoagulants: The patient has                            taken no previous anticoagulant or antiplatelet                            agents. ASA Grade Assessment: II - A patient with                            mild systemic disease. After reviewing the risks                            and benefits, the patient was deemed in                            satisfactory condition to undergo the procedure.  After obtaining informed consent, the colonoscope                            was passed under direct vision. Throughout the                            procedure, the patient's blood pressure, pulse, and                            oxygen saturations were monitored continuously. The                            Olympus CF-HQ190L [id number]) Colonoscope was                            introduced through the anus  and advanced to the the                            cecum, identified by appendiceal orifice and                            ileocecal valve. The colonoscopy was performed                            without difficulty. The patient tolerated the                            procedure well. The quality of the bowel                            preparation was adequate. The ileocecal valve,                            appendiceal orifice, and rectum were photographed. Scope In: 1:32:39 PM Scope Out: 1:52:00 PM Scope Withdrawal Time: 0 hours 15 minutes 53 seconds  Total Procedure Duration: 0 hours 19 minutes 21 seconds  Findings:                 The perianal and digital rectal examinations were                            normal.                           Many medium-mouthed diverticula were found in the                            entire colon, highest burden in sigmoid colon.                           A 4 to 5 mm polyp was found in the recto-sigmoid                            colon. The polyp was sessile. The polyp was removed  with a cold snare. Resection and retrieval were                            complete.                           Internal hemorrhoids were found during                            retroflexion. The hemorrhoids were small.                           The exam was otherwise without abnormality. Complications:            No immediate complications. Estimated blood loss:                            Minimal. Estimated Blood Loss:     Estimated blood loss was minimal. Impression:               - Diverticulosis in the entire examined colon.                           - One 4 to 5 mm polyp at the recto-sigmoid colon,                            removed with a cold snare. Resected and retrieved.                           - Internal hemorrhoids.                           - The examination was otherwise normal. Recommendation:           - Patient has a contact number  available for                            emergencies. The signs and symptoms of potential                            delayed complications were discussed with the                            patient. Return to normal activities tomorrow.                            Written discharge instructions were provided to the                            patient.                           - Resume previous diet.                           - Continue present medications.                           -  Await pathology results. [name], MD 12/28/2020 1:55:25 PM This report has been signed electronically.

## 2020-12-28 NOTE — Progress Notes (Signed)
Called to room to assist during endoscopic procedure.  Patient ID and intended procedure confirmed with present staff. Received instructions for my participation in the procedure from the performing physician.  

## 2020-12-28 NOTE — Progress Notes (Signed)
Pt's states no medical or surgical changes since previsit or office visit.   Check-in-jb  Vital signs-cw

## 2020-12-28 NOTE — Patient Instructions (Signed)
Handouts on polyps ,diverticulosis,& hemorrhoids given to you today  Await pathology results on polyp removed     YOU HAD AN ENDOSCOPIC PROCEDURE TODAY AT [facility]:   Refer to the procedure report that was given to you for any specific questions about what was found during the examination.  If the procedure report does not answer your questions, please call your gastroenterologist to clarify.  If you requested that your care partner not be given the details of your procedure findings, then the procedure report has been included in a sealed envelope for you to review at your convenience later.  YOU SHOULD EXPECT: Some feelings of bloating in the abdomen. Passage of more gas than usual.  Walking can help get rid of the air that was put into your GI tract during the procedure and reduce the bloating. If you had a lower endoscopy (such as a colonoscopy or flexible sigmoidoscopy) you may notice spotting of blood in your stool or on the toilet paper. If you underwent a bowel prep for your procedure, you may not have a normal bowel movement for a few days.  Please Note:  You might notice some irritation and congestion in your nose or some drainage.  This is from the oxygen used during your procedure.  There is no need for concern and it should clear up in a day or so.  SYMPTOMS TO REPORT IMMEDIATELY:  Following lower endoscopy (colonoscopy or flexible sigmoidoscopy):  Excessive amounts of blood in the stool  Significant tenderness or worsening of abdominal pains  Swelling of the abdomen that is new, acute  Fever of 100F or higher   For urgent or emergent issues, a gastroenterologist can be reached at any hour by calling [phone]. Do not use MyChart messaging for urgent concerns.    DIET:  We do recommend a small meal at first, but then you may proceed to your regular diet.  Drink plenty of fluids but you should avoid alcoholic beverages for 24 hours.  ACTIVITY:  You  should plan to take it easy for the rest of today and you should NOT DRIVE or use heavy machinery until tomorrow (because of the sedation medicines used during the test).    FOLLOW UP: Our staff will call the number listed on your records 48-72 hours following your procedure to check on you and address any questions or concerns that you may have regarding the information given to you following your procedure. If we do not reach you, we will leave a message.  We will attempt to reach you two times.  During this call, we will ask if you have developed any symptoms of COVID 19. If you develop any symptoms (ie: fever, flu-like symptoms, shortness of breath, cough etc.) before then, please call [phone].  If you test positive for Covid 19 in the 2 weeks post procedure, please call and report this information to [place].    If any biopsies were taken you will be contacted by phone or by letter within the next 1-3 weeks.  Please call us at [phone] if you have not heard about the biopsies in 3 weeks.    SIGNATURES/CONFIDENTIALITY: You and/or your care partner have signed paperwork which will be entered into your electronic medical record.  These signatures attest to the fact that that the information above on your After Visit Summary has been reviewed and is understood.  Full responsibility of the confidentiality of this discharge information lies with you and/or  your care-partner.

## 2020-12-30 ENCOUNTER — Telehealth: Payer: Self-pay

## 2020-12-30 ENCOUNTER — Telehealth: Payer: Self-pay | Admitting: *Deleted

## 2020-12-30 NOTE — Telephone Encounter (Signed)
  Follow up Call-  Call back number 12/28/2020  Post procedure Call Back phone  # [phone]  Permission to leave phone message Yes  Some recent data might be hidden     Patient questions:  Do you have a fever, pain , or abdominal swelling? No. Pain Score  0 *  Have you tolerated food without any problems? Yes.    Have you been able to return to your normal activities? Yes.    Do you have any questions about your discharge instructions: Diet   No. Medications  No. Follow up visit  No.  Do you have questions or concerns about your Care? No.  Actions: * If pain score is 4 or above: No action needed, pain <4.  Have you developed a fever since your procedure? no  2.   Have you had an respiratory symptoms (SOB or cough) since your procedure? no  3.   Have you tested positive for COVID 19 since your procedure no  4.   Have you had any family members/close contacts diagnosed with the COVID 19 since your procedure?  no   If yes to any of these questions please route to Joylene John, RN and Joella Prince, RN

## 2020-12-30 NOTE — Telephone Encounter (Signed)
  Follow up Call-  Call back number 12/28/2020  Post procedure Call Back phone  # [phone]  Permission to leave phone message Yes  Some recent data might be hidden     Patient questions:  Do you have a fever, pain , or abdominal swelling? No. Pain Score  0 *  Have you tolerated food without any problems? Yes.    Have you been able to return to your normal activities? Yes.    Do you have any questions about your discharge instructions: Diet   No. Medications  No. Follow up visit  No.  Do you have questions or concerns about your Care? No.  Actions: * If pain score is 4 or above: No action needed, pain <4.   Have you developed a fever since your procedure? no  2.   Have you had an respiratory symptoms (SOB or cough) since your procedure? no  3.   Have you tested positive for COVID 19 since your procedure no  4.   Have you had any family members/close contacts diagnosed with the COVID 19 since your procedure?  no   If yes to any of these questions please route to Joylene John, RN and Joella Prince, RN

## 2021-01-02 ENCOUNTER — Encounter: Payer: Self-pay | Admitting: Family Medicine

## 2021-01-31 ENCOUNTER — Ambulatory Visit: Payer: 59 | Admitting: Family Medicine

## 2021-02-06 ENCOUNTER — Ambulatory Visit: Payer: 59 | Admitting: Family Medicine

## 2021-03-01 ENCOUNTER — Other Ambulatory Visit: Payer: Self-pay

## 2021-03-01 ENCOUNTER — Encounter: Payer: Self-pay | Admitting: Family Medicine

## 2021-03-01 ENCOUNTER — Ambulatory Visit: Payer: 59 | Admitting: Family Medicine

## 2021-03-01 DIAGNOSIS — I10 Essential (primary) hypertension: Secondary | ICD-10-CM | POA: Diagnosis not present

## 2021-03-01 NOTE — Progress Notes (Signed)
Tyler Moody - 50 y.o. male MRN [id number]  Date of birth: 05-22-1971  Subjective Chief Complaint  Patient presents with   Hypertension    HPI Tyler Moody is 50 y.o. male here today for follow up of HTN.  He has been working on lifestyle change and weight loss to improve his blood pressure.  He is eating more vegetables, especially cabbage and leaner meats. Readings at home have been well controlled.  He denies symptoms related to HTN including chest pain, shortness of breath, palpitations, headache or vision changes.    ROS:  A comprehensive ROS was completed and negative except as noted per HPI  No Known Allergies  Past Medical History:  Diagnosis Date   Allergy    FALL ALLERGIES   Heart murmur    HTN (hypertension) 04/02/2017   Left inguinal hernia 01/06/2013    Past Surgical History:  Procedure Laterality Date   DENTAL SURGERY     #13 IMPLANT, MOLAR RIGHT MOUTH IMPLANT   FINGER FRACTURE SURGERY Left    thumb WITH BONE GRAFT   MOUTH SURGERY     BACK MOLARS REMOVED   SKIN GRAFT     TO [facility]   WRIST FRACTURE SURGERY Right    AGE 56    Social History   Socioeconomic History   Marital status: Married    Spouse name: Not on file   Number of children: Not on file   Years of education: Not on file   Highest education level: Not on file  Occupational History   Not on file  Tobacco Use   Smoking status: Never   Smokeless tobacco: Never  Vaping Use   Vaping Use: Never used  Substance and Sexual Activity   Alcohol use: Yes    Alcohol/week: 1.0 - 2.0 standard drink    Types: 1 - 2 Standard drinks or equivalent per week    Comment: OCC   Drug use: No   Sexual activity: Yes    Partners: Female  Other Topics Concern   Not on file  Social History Narrative   Not on file   Social Determinants of Health   Financial Resource Strain: Not on file  Food Insecurity: Not on file  Transportation Needs: Not on file  Physical Activity: Not on file  Stress: Not on  file  Social Connections: Not on file    Family History  Problem Relation Age of Onset   Breast cancer Mother    Cancer Mother        breast CA   Diverticulosis Mother    Hypertension Father    Colon cancer Neg Hx    Colon polyps Neg Hx    Esophageal cancer Neg Hx    Rectal cancer Neg Hx    Stomach cancer Neg Hx     Health Maintenance  Topic Date Due   Hepatitis C Screening  Never done   Zoster Vaccines- Shingrix (1 of 2) Never done   COVID-19 Vaccine (2 - Booster for [employer] series) 04/08/2020   INFLUENZA VACCINE  05/02/2022 (Originally 01/30/2021)   TETANUS/TDAP  02/22/2024   COLONOSCOPY (Pts 45-10yr Insurance coverage will need to be confirmed)  12/29/2027   HIV Screening  Completed   Pneumococcal Vaccine 03063Years old  Aged Out   HPV VACCINES  Aged Out     ----------------------------------------------------------------------------------------------------------------------------------------------------------------------------------------------------------------- Physical Exam BP 134/81 (BP Location: Left Arm, Patient Position: Sitting, Cuff Size: Normal)   Pulse 73   Temp 98.2 F (36.8 C)  Ht '5\' 11"'$  (1.803 m)   Wt 214 lb 4.8 oz (97.2 kg)   SpO2 91%   BMI 29.89 kg/m   Physical Exam Constitutional:      Appearance: Normal appearance.  Cardiovascular:     Rate and Rhythm: Normal rate and regular rhythm.  Pulmonary:     Effort: Pulmonary effort is normal.     Breath sounds: Normal breath sounds.  Musculoskeletal:     Cervical back: Neck supple.  Neurological:     General: No focal deficit present.     Mental Status: He is alert.  Psychiatric:        Mood and Affect: Mood normal.        Behavior: Behavior normal.    ------------------------------------------------------------------------------------------------------------------------------------------------------------------------------------------------------------------- Assessment and Plan  No  problem-specific Assessment & Plan notes found for this encounter.   No orders of the defined types were placed in this encounter.   Return in about 6 months (around 08/29/2021) for BP.    This visit occurred during the SARS-CoV-2 public health emergency.  Safety protocols were in place, including screening questions prior to the visit, additional usage of staff PPE, and extensive cleaning of exam room while observing appropriate contact time as indicated for disinfecting solutions.

## 2021-03-01 NOTE — Assessment & Plan Note (Signed)
Blood pressure is at goal at for age and co-morbidities.  I recommend continued adherence to healthy diet.  In addition they were instructed to follow a low sodium diet with regular exercise to help to maintain adequate control of blood pressure.

## 2021-03-01 NOTE — Patient Instructions (Signed)
Great to see you today! Keep up the hard work! See me again in 6 months.

## 2021-04-11 ENCOUNTER — Other Ambulatory Visit: Payer: Self-pay | Admitting: Family Medicine

## 2021-04-11 ENCOUNTER — Encounter: Payer: Self-pay | Admitting: Family Medicine

## 2021-04-11 ENCOUNTER — Other Ambulatory Visit (HOSPITAL_COMMUNITY): Payer: Self-pay

## 2021-04-11 MED ORDER — TRIAMCINOLONE ACETONIDE 0.1 % EX CREA
1.0000 "application " | TOPICAL_CREAM | Freq: Two times a day (BID) | CUTANEOUS | 0 refills | Status: AC
  Filled 2021-04-11: qty 30, 10d supply, fill #0

## 2021-08-25 ENCOUNTER — Ambulatory Visit: Payer: 59 | Admitting: Family Medicine

## 2021-10-16 ENCOUNTER — Encounter: Payer: Self-pay | Admitting: Family Medicine

## 2021-10-16 ENCOUNTER — Ambulatory Visit: Payer: 59 | Admitting: Family Medicine

## 2021-10-16 VITALS — BP 131/85 | HR 73 | Ht 71.0 in | Wt 207.0 lb

## 2021-10-16 DIAGNOSIS — Z23 Encounter for immunization: Secondary | ICD-10-CM

## 2021-10-16 DIAGNOSIS — I10 Essential (primary) hypertension: Secondary | ICD-10-CM | POA: Diagnosis not present

## 2021-10-16 DIAGNOSIS — Z125 Encounter for screening for malignant neoplasm of prostate: Secondary | ICD-10-CM | POA: Diagnosis not present

## 2021-10-16 DIAGNOSIS — Z13220 Encounter for screening for lipoid disorders: Secondary | ICD-10-CM | POA: Diagnosis not present

## 2021-10-16 NOTE — Progress Notes (Signed)
?Tyler Moody - 51 y.o. male MRN [id number]  Date of birth: Dec 21, 1970 ? ?Subjective ?Chief Complaint  ?Patient presents with  ? Hypertension  ? Follow-up  ? ? ?HPI ?Tyler Moody is a 51 y.o. male here today for follow up of HTN.  Reports he is doing well.  He is not currently taking medication for management of HTN.  BP today is well controlled.  He has been working on weight loss and walking about 5 miles per day as well as ice skating ? ? ?ROS:  A comprehensive ROS was completed and negative except as noted per HPI ? ?No Known Allergies ? ?Past Medical History:  ?Diagnosis Date  ? Allergy   ? FALL ALLERGIES  ? Heart murmur   ? HTN (hypertension) 04/02/2017  ? Left inguinal hernia 01/06/2013  ? ? ?Past Surgical History:  ?Procedure Laterality Date  ? DENTAL SURGERY    ? #13 IMPLANT, MOLAR RIGHT MOUTH IMPLANT  ? FINGER FRACTURE SURGERY Left   ? thumb WITH BONE GRAFT  ? MOUTH SURGERY    ? BACK MOLARS REMOVED  ? SKIN GRAFT    ? TO [facility]  ? WRIST FRACTURE SURGERY Right   ? AGE 67  ? ? ?Social History  ? ?Socioeconomic History  ? Marital status: Married  ?  Spouse name: Not on file  ? Number of children: Not on file  ? Years of education: Not on file  ? Highest education level: Not on file  ?Occupational History  ? Not on file  ?Tobacco Use  ? Smoking status: Never  ? Smokeless tobacco: Never  ?Vaping Use  ? Vaping Use: Never used  ?Substance and Sexual Activity  ? Alcohol use: Yes  ?  Alcohol/week: 1.0 - 2.0 standard drink  ?  Types: 1 - 2 Standard drinks or equivalent per week  ?  Comment: OCC  ? Drug use: No  ? Sexual activity: Yes  ?  Partners: Female  ?Other Topics Concern  ? Not on file  ?Social History Narrative  ? Not on file  ? ?Social Determinants of Health  ? ?Financial Resource Strain: Not on file  ?Food Insecurity: Not on file  ?Transportation Needs: Not on file  ?Physical Activity: Not on file  ?Stress: Not on file  ?Social Connections: Not on file  ? ? ?Family History  ?Problem Relation Age of Onset   ? Breast cancer Mother   ? Cancer Mother   ?     breast CA  ? Diverticulosis Mother   ? Hypertension Father   ? Colon cancer Neg Hx   ? Colon polyps Neg Hx   ? Esophageal cancer Neg Hx   ? Rectal cancer Neg Hx   ? Stomach cancer Neg Hx   ? ? ?Health Maintenance  ?Topic Date Due  ? Hepatitis C Screening  Never done  ? Zoster Vaccines- Shingrix (1 of 2) Never done  ? COVID-19 Vaccine (2 - Booster for Janssen series) 04/08/2020  ? INFLUENZA VACCINE  01/30/2022  ? TETANUS/TDAP  02/22/2024  ? COLONOSCOPY (Pts 45-24yr Insurance coverage will need to be confirmed)  12/29/2027  ? HIV Screening  Completed  ? HPV VACCINES  Aged Out  ? ? ? ?----------------------------------------------------------------------------------------------------------------------------------------------------------------------------------------------------------------- ?Physical Exam ?BP 131/85   Pulse 73   Ht '5\' 11"'$  (1.803 m)   Wt 207 lb (93.9 kg)   SpO2 98%   BMI 28.87 kg/m?  ? ?Physical Exam ?Constitutional:   ?   Appearance: Normal appearance.  ?  Cardiovascular:  ?   Rate and Rhythm: Normal rate and regular rhythm.  ?Pulmonary:  ?   Effort: Pulmonary effort is normal.  ?   Breath sounds: Normal breath sounds.  ?Neurological:  ?   Mental Status: He is alert.  ?Psychiatric:     ?   Mood and Affect: Mood normal.  ? ? ?------------------------------------------------------------------------------------------------------------------------------------------------------------------------------------------------------------------- ?Assessment and Plan ? ?HTN (hypertension) ?BP remains well controlled.  Continue dietary and lifestyle change for management of blood pressure.  Updating labs.  F/u in 1 year.  ? ? ?No orders of the defined types were placed in this encounter. ? ? ?No follow-ups on file. ? ? ? ?This visit occurred during the SARS-CoV-2 public health emergency.  Safety protocols were in place, including screening questions prior to the  visit, additional usage of staff PPE, and extensive cleaning of exam room while observing appropriate contact time as indicated for disinfecting solutions.  ? ?

## 2021-10-16 NOTE — Assessment & Plan Note (Signed)
BP remains well controlled.  Continue dietary and lifestyle change for management of blood pressure.  Updating labs.  F/u in 1 year.  ?

## 2021-10-16 NOTE — Addendum Note (Signed)
Addended by: Peggye Ley on: 10/16/2021 10:10 AM ? ? Modules accepted: Orders ? ?

## 2021-10-17 LAB — CBC WITH DIFFERENTIAL/PLATELET
Absolute Monocytes: 558 cells/uL (ref 200–950)
Basophils Absolute: 41 cells/uL (ref 0–200)
Basophils Relative: 0.6 %
Eosinophils Absolute: 218 cells/uL (ref 15–500)
Eosinophils Relative: 3.2 %
HCT: 44.7 % (ref 38.5–50.0)
Hemoglobin: 15.1 g/dL (ref 13.2–17.1)
Lymphs Abs: 2074 cells/uL (ref 850–3900)
MCH: 30.8 pg (ref 27.0–33.0)
MCHC: 33.8 g/dL (ref 32.0–36.0)
MCV: 91 fL (ref 80.0–100.0)
MPV: 9.2 fL (ref 7.5–12.5)
Monocytes Relative: 8.2 %
Neutro Abs: 3910 cells/uL (ref 1500–7800)
Neutrophils Relative %: 57.5 %
Platelets: 315 10*3/uL (ref 140–400)
RBC: 4.91 10*6/uL (ref 4.20–5.80)
RDW: 12.4 % (ref 11.0–15.0)
Total Lymphocyte: 30.5 %
WBC: 6.8 10*3/uL (ref 3.8–10.8)

## 2021-10-17 LAB — COMPLETE METABOLIC PANEL WITH GFR
AG Ratio: 1.9 (calc) (ref 1.0–2.5)
ALT: 25 U/L (ref 9–46)
AST: 26 U/L (ref 10–35)
Albumin: 4.4 g/dL (ref 3.6–5.1)
Alkaline phosphatase (APISO): 58 U/L (ref 35–144)
BUN: 16 mg/dL (ref 7–25)
CO2: 26 mmol/L (ref 20–32)
Calcium: 9.5 mg/dL (ref 8.6–10.3)
Chloride: 106 mmol/L (ref 98–110)
Creat: 1.15 mg/dL (ref 0.70–1.30)
Globulin: 2.3 g/dL (calc) (ref 1.9–3.7)
Glucose, Bld: 103 mg/dL — ABNORMAL HIGH (ref 65–99)
Potassium: 4.3 mmol/L (ref 3.5–5.3)
Sodium: 140 mmol/L (ref 135–146)
Total Bilirubin: 0.8 mg/dL (ref 0.2–1.2)
Total Protein: 6.7 g/dL (ref 6.1–8.1)
eGFR: 77 mL/min/{1.73_m2} (ref >=60)

## 2021-10-17 LAB — LIPID PANEL W/REFLEX DIRECT LDL
Cholesterol: 171 mg/dL (ref <200)
HDL: 57 mg/dL (ref >=40)
LDL Cholesterol (Calc): 100 mg/dL (calc) — ABNORMAL HIGH
Non-HDL Cholesterol (Calc): 114 mg/dL (calc) (ref <130)
Total CHOL/HDL Ratio: 3 (calc) (ref <5.0)
Triglycerides: 59 mg/dL (ref <150)

## 2021-10-17 LAB — PSA: PSA: 1.38 ng/mL (ref <=4.00)

## 2022-10-18 ENCOUNTER — Encounter: Payer: Self-pay | Admitting: Family Medicine

## 2022-10-18 ENCOUNTER — Ambulatory Visit (INDEPENDENT_AMBULATORY_CARE_PROVIDER_SITE_OTHER): Payer: Commercial Managed Care - PPO | Admitting: Family Medicine

## 2022-10-18 VITALS — BP 130/87 | HR 73 | Ht 71.0 in | Wt 226.0 lb

## 2022-10-18 DIAGNOSIS — Z125 Encounter for screening for malignant neoplasm of prostate: Secondary | ICD-10-CM

## 2022-10-18 DIAGNOSIS — Z23 Encounter for immunization: Secondary | ICD-10-CM

## 2022-10-18 DIAGNOSIS — Z13220 Encounter for screening for lipoid disorders: Secondary | ICD-10-CM | POA: Diagnosis not present

## 2022-10-18 DIAGNOSIS — Z1159 Encounter for screening for other viral diseases: Secondary | ICD-10-CM

## 2022-10-18 DIAGNOSIS — I10 Essential (primary) hypertension: Secondary | ICD-10-CM | POA: Diagnosis not present

## 2022-10-18 DIAGNOSIS — Z0000 Encounter for general adult medical examination without abnormal findings: Secondary | ICD-10-CM

## 2022-10-18 NOTE — Patient Instructions (Signed)

## 2022-10-18 NOTE — Assessment & Plan Note (Addendum)
Well adult Orders Placed This Encounter  Procedures   COMPLETE METABOLIC PANEL WITH GFR   CBC with Differential   Lipid Panel w/reflex Direct LDL   PSA   Hepatitis C Antibody  Immunizations:  Shingrix #2 given today Screenings: per lab orders Anticipatory guidance/Risk factor reduction:  Recommendations per AVS.

## 2022-10-18 NOTE — Progress Notes (Signed)
Tyler Moody - 52 y.o. male MRN [id number]  Date of birth: 08-27-1970  Subjective Chief Complaint  Patient presents with   Annual Exam    HPI Tyler Moody is a 52 y.o. male here today for annual exam.   He reports that he is doing well.   He reports that he is moderately active.  He is doing intermittent fasting.   He is a non-smoker.  Rare EtOH use.   Due for Shingrix #2  Review of Systems  Constitutional:  Negative for chills, fever, malaise/fatigue and weight loss.  HENT:  Negative for congestion, ear pain and sore throat.   Eyes:  Negative for blurred vision, double vision and pain.  Respiratory:  Negative for cough and shortness of breath.   Cardiovascular:  Negative for chest pain and palpitations.  Gastrointestinal:  Negative for abdominal pain, blood in stool, constipation, heartburn and nausea.  Genitourinary:  Negative for dysuria and urgency.  Musculoskeletal:  Negative for joint pain and myalgias.  Neurological:  Negative for dizziness and headaches.  Endo/Heme/Allergies:  Does not bruise/bleed easily.  Psychiatric/Behavioral:  Negative for depression. The patient is not nervous/anxious and does not have insomnia.      No Known Allergies  Past Medical History:  Diagnosis Date   Allergy    FALL ALLERGIES   Heart murmur    HTN (hypertension) 04/02/2017   Left inguinal hernia 01/06/2013    Past Surgical History:  Procedure Laterality Date   DENTAL SURGERY     #13 IMPLANT, MOLAR RIGHT MOUTH IMPLANT   FINGER FRACTURE SURGERY Left    thumb WITH BONE GRAFT   MOUTH SURGERY     BACK MOLARS REMOVED   SKIN GRAFT     TO [facility]   WRIST FRACTURE SURGERY Right    AGE 80    Social History   Socioeconomic History   Marital status: Married    Spouse name: Not on file   Number of children: Not on file   Years of education: Not on file   Highest education level: Not on file  Occupational History   Not on file  Tobacco Use   Smoking status: Never    Smokeless tobacco: Never  Vaping Use   Vaping Use: Never used  Substance and Sexual Activity   Alcohol use: Yes    Alcohol/week: 1.0 - 2.0 standard drink of alcohol    Types: 1 - 2 Standard drinks or equivalent per week    Comment: OCC   Drug use: No   Sexual activity: Yes    Partners: Female  Other Topics Concern   Not on file  Social History Narrative   Not on file   Social Determinants of Health   Financial Resource Strain: Not on file  Food Insecurity: Not on file  Transportation Needs: Not on file  Physical Activity: Sufficiently Active (03/14/2018)   Exercise Vital Sign    Days of Exercise per Week: 4 days    Minutes of Exercise per Session: 40 min  Stress: Not on file  Social Connections: Not on file    Family History  Problem Relation Age of Onset   Breast cancer Mother    Cancer Mother        breast CA   Diverticulosis Mother    Hypertension Father    Colon cancer Neg Hx    Colon polyps Neg Hx    Esophageal cancer Neg Hx    Rectal cancer Neg Hx    Stomach cancer  Neg Hx     Health Maintenance  Topic Date Due   Zoster Vaccines- Shingrix (2 of 2) 12/11/2021   COVID-19 Vaccine (2 - 2023-24 season) 04/05/2023 (Originally 03/02/2022)   Hepatitis C Screening  10/18/2023 (Originally 01/08/1988)   INFLUENZA VACCINE  01/31/2023   DTaP/Tdap/Td (2 - Td or Tdap) 02/22/2024   COLONOSCOPY (Pts 45-60yrs Insurance coverage will need to be confirmed)  12/29/2027   HIV Screening  Completed   HPV VACCINES  Aged Out     ----------------------------------------------------------------------------------------------------------------------------------------------------------------------------------------------------------------- Physical Exam BP 130/87 (BP Location: Left Arm, Patient Position: Sitting, Cuff Size: Large)   Pulse 73   Ht  (1.803 m)   Wt 226 lb (102.5 kg)   SpO2 95%   BMI 31.52 kg/m   Physical Exam Constitutional:      General: He is not in acute  distress. HENT:     Head: Normocephalic and atraumatic.     Right Ear: Tympanic membrane and external ear normal.     Left Ear: Tympanic membrane and external ear normal.  Eyes:     General: No scleral icterus. Neck:     Thyroid: No thyromegaly.  Cardiovascular:     Rate and Rhythm: Normal rate and regular rhythm.     Heart sounds: Normal heart sounds.  Pulmonary:     Effort: Pulmonary effort is normal.     Breath sounds: Normal breath sounds.  Abdominal:     General: Bowel sounds are normal. There is no distension.     Palpations: Abdomen is soft.     Tenderness: There is no abdominal tenderness. There is no guarding.  Musculoskeletal:     Cervical back: Normal range of motion.  Lymphadenopathy:     Cervical: No cervical adenopathy.  Skin:    General: Skin is warm and dry.     Findings: No rash.  Neurological:     Mental Status: He is alert and oriented to person, place, and time.     Cranial Nerves: No cranial nerve deficit.     Motor: No abnormal muscle tone.  Psychiatric:        Mood and Affect: Mood normal.        Behavior: Behavior normal.     ------------------------------------------------------------------------------------------------------------------------------------------------------------------------------------------------------------------- Assessment and Plan  Well adult exam Well adult Orders Placed This Encounter  Procedures   COMPLETE METABOLIC PANEL WITH GFR   CBC with Differential   Lipid Panel w/reflex Direct LDL   PSA   Hepatitis C Antibody  Immunizations:  Shingrix #2 given today Screenings: per lab orders Anticipatory guidance/Risk factor reduction:  Recommendations per AVS.    No orders of the defined types were placed in this encounter.   No follow-ups on file.    This visit occurred during the SARS-CoV-2 public health emergency.  Safety protocols were in place, including screening questions prior to the visit, additional usage  of staff PPE, and extensive cleaning of exam room while observing appropriate contact time as indicated for disinfecting solutions.

## 2022-10-19 LAB — COMPLETE METABOLIC PANEL WITH GFR
AG Ratio: 1.8 (calc) (ref 1.0–2.5)
ALT: 40 U/L (ref 9–46)
AST: 23 U/L (ref 10–35)
Albumin: 4.4 g/dL (ref 3.6–5.1)
Alkaline phosphatase (APISO): 61 U/L (ref 35–144)
BUN: 13 mg/dL (ref 7–25)
CO2: 29 mmol/L (ref 20–32)
Calcium: 9.5 mg/dL (ref 8.6–10.3)
Chloride: 103 mmol/L (ref 98–110)
Creat: 1.09 mg/dL (ref 0.70–1.30)
Globulin: 2.4 g/dL (calc) (ref 1.9–3.7)
Glucose, Bld: 107 mg/dL — ABNORMAL HIGH (ref 65–99)
Potassium: 4.6 mmol/L (ref 3.5–5.3)
Sodium: 139 mmol/L (ref 135–146)
Total Bilirubin: 0.6 mg/dL (ref 0.2–1.2)
Total Protein: 6.8 g/dL (ref 6.1–8.1)
eGFR: 82 mL/min/{1.73_m2} (ref >=60)

## 2022-10-19 LAB — CBC WITH DIFFERENTIAL/PLATELET
Absolute Monocytes: 493 cells/uL (ref 200–950)
Basophils Absolute: 41 cells/uL (ref 0–200)
Basophils Relative: 0.7 %
Eosinophils Absolute: 180 cells/uL (ref 15–500)
Eosinophils Relative: 3.1 %
HCT: 46.6 % (ref 38.5–50.0)
Hemoglobin: 15.5 g/dL (ref 13.2–17.1)
Lymphs Abs: 2158 cells/uL (ref 850–3900)
MCH: 29.9 pg (ref 27.0–33.0)
MCHC: 33.3 g/dL (ref 32.0–36.0)
MCV: 90 fL (ref 80.0–100.0)
MPV: 8.9 fL (ref 7.5–12.5)
Monocytes Relative: 8.5 %
Neutro Abs: 2929 cells/uL (ref 1500–7800)
Neutrophils Relative %: 50.5 %
Platelets: 348 10*3/uL (ref 140–400)
RBC: 5.18 10*6/uL (ref 4.20–5.80)
RDW: 12.1 % (ref 11.0–15.0)
Total Lymphocyte: 37.2 %
WBC: 5.8 10*3/uL (ref 3.8–10.8)

## 2022-10-19 LAB — PSA: PSA: 0.72 ng/mL (ref <=4.00)

## 2022-10-19 LAB — LIPID PANEL W/REFLEX DIRECT LDL
Cholesterol: 158 mg/dL (ref <200)
HDL: 43 mg/dL (ref >=40)
LDL Cholesterol (Calc): 93 mg/dL (calc)
Non-HDL Cholesterol (Calc): 115 mg/dL (calc) (ref <130)
Total CHOL/HDL Ratio: 3.7 (calc) (ref <5.0)
Triglycerides: 127 mg/dL (ref <150)

## 2022-10-19 LAB — HEPATITIS C ANTIBODY: Hepatitis C Ab: NONREACTIVE

## 2023-06-04 ENCOUNTER — Telehealth: Payer: Self-pay | Admitting: Family Medicine

## 2023-06-04 NOTE — Telephone Encounter (Signed)
Pt wanted to know if needed a follow up appt he says he has been feeling  good and has been walking and walking ten miles a day and blood sugar has been just a little higher but he feels better but he would like a call back to see if he needs to come back    in to be seen he has lost 20 pounds and stated he is sleeping better.

## 2023-06-04 NOTE — Telephone Encounter (Signed)
Please advise. Last appointment was 10/18/22. Thanks in advance.

## 2023-06-05 NOTE — Telephone Encounter (Signed)
Per patient, he is feeling pretty confident of his lifestyle changes. He noticed some positive changes in the way he feels and his body. He is going to hold off on having lab work at this time. He stated that he will see the provider when due for his yearly check up.

## 2024-02-04 ENCOUNTER — Encounter: Admitting: Family Medicine

## 2024-02-26 ENCOUNTER — Encounter: Payer: Self-pay | Admitting: Family Medicine

## 2024-02-26 ENCOUNTER — Ambulatory Visit (INDEPENDENT_AMBULATORY_CARE_PROVIDER_SITE_OTHER): Admitting: Family Medicine

## 2024-02-26 VITALS — BP 137/75 | HR 70 | Ht 71.0 in | Wt 226.7 lb

## 2024-02-26 DIAGNOSIS — Z23 Encounter for immunization: Secondary | ICD-10-CM | POA: Diagnosis not present

## 2024-02-26 DIAGNOSIS — Z125 Encounter for screening for malignant neoplasm of prostate: Secondary | ICD-10-CM | POA: Diagnosis not present

## 2024-02-26 DIAGNOSIS — I10 Essential (primary) hypertension: Secondary | ICD-10-CM

## 2024-02-26 DIAGNOSIS — Z13220 Encounter for screening for lipoid disorders: Secondary | ICD-10-CM

## 2024-02-26 DIAGNOSIS — Z0000 Encounter for general adult medical examination without abnormal findings: Secondary | ICD-10-CM

## 2024-02-26 NOTE — Assessment & Plan Note (Signed)
 Well adult Orders Placed This Encounter  Procedures   Tdap vaccine greater than or equal to 54yo IM   CMP14+EGFR   CBC with Differential/Platelet   Lipid Panel With LDL/HDL Ratio   PSA  Immunizations:  UTD Screenings: per lab orders Anticipatory guidance/Risk factor reduction:  Recommendations per AVS.

## 2024-02-26 NOTE — Patient Instructions (Signed)

## 2024-02-26 NOTE — Progress Notes (Signed)
 Tyler Moody - 53 y.o. male MRN [id number]  Date of birth: [birth year]  Subjective Chief Complaint  Patient presents with   Annual Exam    HPI Tyler Moody is a 53 y.o. male here today for annual exam.   He reports that he is doing pretty good.   He remains moderately active.  He feels that diet is pretty well controlled.   He is a non-smoker.  Occasional EtOH.   Review of Systems  Constitutional:  Negative for chills, fever, malaise/fatigue and weight loss.  HENT:  Negative for congestion, ear pain and sore throat.   Eyes:  Negative for blurred vision, double vision and pain.  Respiratory:  Negative for cough and shortness of breath.   Cardiovascular:  Negative for chest pain and palpitations.  Gastrointestinal:  Negative for abdominal pain, blood in stool, constipation, heartburn and nausea.  Genitourinary:  Negative for dysuria and urgency.  Musculoskeletal:  Negative for joint pain and myalgias.  Neurological:  Negative for dizziness and headaches.  Endo/Heme/Allergies:  Does not bruise/bleed easily.  Psychiatric/Behavioral:  Negative for depression. The patient is not nervous/anxious and does not have insomnia.     No Known Allergies  Past Medical History:  Diagnosis Date   Allergy    FALL ALLERGIES   Heart murmur    HTN (hypertension) 04/02/2017   Left inguinal hernia 01/06/2013    Past Surgical History:  Procedure Laterality Date   DENTAL SURGERY     #13 IMPLANT, MOLAR RIGHT MOUTH IMPLANT   FINGER FRACTURE SURGERY Left    thumb WITH BONE GRAFT   MOUTH SURGERY     BACK MOLARS REMOVED   SKIN GRAFT     TO [facility]   WRIST FRACTURE SURGERY Right    AGE 89    Social History   Socioeconomic History   Marital status: Married    Spouse name: Not on file   Number of children: Not on file   Years of education: Not on file   Highest education level: Not on file  Occupational History   Not on file  Tobacco Use   Smoking status: Never   Smokeless tobacco:  Never  Vaping Use   Vaping status: Never Used  Substance and Sexual Activity   Alcohol use: Yes    Alcohol/week: 1.0 - 2.0 standard drink of alcohol    Types: 1 - 2 Standard drinks or equivalent per week    Comment: OCC   Drug use: No   Sexual activity: Yes    Partners: Female  Other Topics Concern   Not on file  Social History Narrative   Not on file   Social Drivers of Health   Financial Resource Strain: Low Risk  (02/26/2024)   Overall Financial Resource Strain (CARDIA)    Difficulty of Paying Living Expenses: Not hard at all  Food Insecurity: No Food Insecurity (02/26/2024)   Hunger Vital Sign    Worried About Running Out of Food in the Last Year: Never true    Ran Out of Food in the Last Year: Never true  Transportation Needs: No Transportation Needs (02/26/2024)   PRAPARE - Administrator, Civil Service (Medical): No    Lack of Transportation (Non-Medical): No  Physical Activity: Sufficiently Active (02/26/2024)   Exercise Vital Sign    Days of Exercise per Week: 4 days    Minutes of Exercise per Session: 40 min  Stress: No Stress Concern Present (02/26/2024)   [employer] of Occupational  Health - Occupational Stress Questionnaire    Feeling of Stress: Not at all  Social Connections: Socially Integrated (02/26/2024)   Social Connection and Isolation Panel    Frequency of Communication with Friends and Family: More than three times a week    Frequency of Social Gatherings with Friends and Family: Three times a week    Attends Religious Services: 1 to 4 times per year    Active Member of Clubs or Organizations: Yes    Attends Banker Meetings: 1 to 4 times per year    Marital Status: Married    Family History  Problem Relation Age of Onset   Breast cancer Mother    Cancer Mother        breast CA   Diverticulosis Mother    Hypertension Father    Colon cancer Neg Hx    Colon polyps Neg Hx    Esophageal cancer Neg Hx    Rectal cancer  Neg Hx    Stomach cancer Neg Hx     Health Maintenance  Topic Date Due   Hepatitis B Vaccines 19-59 Average Risk (1 of 3 - 19+ 3-dose series) Never done   Pneumococcal Vaccine: 50+ Years (1 of 1 - PCV) Never done   COVID-19 Vaccine (2 - 2024-25 season) 03/03/2023   INFLUENZA VACCINE  01/31/2024   Colonoscopy  12/29/2027   DTaP/Tdap/Td (3 - Td or Tdap) 02/25/2034   Hepatitis C Screening  Completed   HIV Screening  Completed   Zoster Vaccines- Shingrix   Completed   HPV VACCINES  Aged Out   Meningococcal B Vaccine  Aged Out     ----------------------------------------------------------------------------------------------------------------------------------------------------------------------------------------------------------------- Physical Exam BP 137/75   Pulse 70   Ht 5' 11 (1.803 m)   Wt 226 lb 11.2 oz (102.8 kg)   SpO2 98%   BMI 31.62 kg/m   Physical Exam Constitutional:      General: He is not in acute distress. HENT:     Head: Normocephalic and atraumatic.     Right Ear: Tympanic membrane and external ear normal.     Left Ear: Tympanic membrane and external ear normal.  Eyes:     General: No scleral icterus. Neck:     Thyroid : No thyromegaly.  Cardiovascular:     Rate and Rhythm: Normal rate and regular rhythm.     Heart sounds: Normal heart sounds.  Pulmonary:     Effort: Pulmonary effort is normal.     Breath sounds: Normal breath sounds.  Abdominal:     General: Bowel sounds are normal. There is no distension.     Palpations: Abdomen is soft.     Tenderness: There is no abdominal tenderness. There is no guarding.  Musculoskeletal:     Cervical back: Normal range of motion.  Lymphadenopathy:     Cervical: No cervical adenopathy.  Skin:    General: Skin is warm and dry.     Findings: No rash.  Neurological:     Mental Status: He is alert and oriented to person, place, and time.     Cranial Nerves: No cranial nerve deficit.     Motor: No abnormal  muscle tone.  Psychiatric:        Mood and Affect: Mood normal.        Behavior: Behavior normal.     ------------------------------------------------------------------------------------------------------------------------------------------------------------------------------------------------------------------- Assessment and Plan  Well adult exam Well adult Orders Placed This Encounter  Procedures   Tdap vaccine greater than or equal to 7yo IM  CMP14+EGFR   CBC with Differential/Platelet   Lipid Panel With LDL/HDL Ratio   PSA  Immunizations:  UTD Screenings: per lab orders Anticipatory guidance/Risk factor reduction:  Recommendations per AVS.    No orders of the defined types were placed in this encounter.   No follow-ups on file.

## 2024-02-27 LAB — CBC WITH DIFFERENTIAL/PLATELET
Basophils Absolute: 0 x10E3/uL (ref 0.0–0.2)
Basos: 1 %
EOS (ABSOLUTE): 0.2 x10E3/uL (ref 0.0–0.4)
Eos: 2 %
Hematocrit: 45.3 % (ref 37.5–51.0)
Hemoglobin: 15.3 g/dL (ref 13.0–17.7)
Immature Grans (Abs): 0 x10E3/uL (ref 0.0–0.1)
Immature Granulocytes: 0 %
Lymphocytes Absolute: 2.4 x10E3/uL (ref 0.7–3.1)
Lymphs: 38 %
MCH: 31.5 pg (ref 26.6–33.0)
MCHC: 33.8 g/dL (ref 31.5–35.7)
MCV: 93 fL (ref 79–97)
Monocytes Absolute: 0.6 x10E3/uL (ref 0.1–0.9)
Monocytes: 9 %
Neutrophils Absolute: 3.2 x10E3/uL (ref 1.4–7.0)
Neutrophils: 50 %
Platelets: 300 x10E3/uL (ref 150–450)
RBC: 4.86 x10E6/uL (ref 4.14–5.80)
RDW: 12.2 % (ref 11.6–15.4)
WBC: 6.3 x10E3/uL (ref 3.4–10.8)

## 2024-02-27 LAB — CMP14+EGFR
ALT: 40 IU/L (ref 0–44)
AST: 35 IU/L (ref 0–40)
Albumin: 4.2 g/dL (ref 3.8–4.9)
Alkaline Phosphatase: 82 IU/L (ref 44–121)
BUN/Creatinine Ratio: 17 (ref 9–20)
BUN: 17 mg/dL (ref 6–24)
Bilirubin Total: 0.4 mg/dL (ref 0.0–1.2)
CO2: 23 mmol/L (ref 20–29)
Calcium: 9.3 mg/dL (ref 8.7–10.2)
Chloride: 99 mmol/L (ref 96–106)
Creatinine, Ser: 1.02 mg/dL (ref 0.76–1.27)
Globulin, Total: 2.1 g/dL (ref 1.5–4.5)
Glucose: 101 mg/dL — ABNORMAL HIGH (ref 70–99)
Potassium: 4.4 mmol/L (ref 3.5–5.2)
Sodium: 137 mmol/L (ref 134–144)
Total Protein: 6.3 g/dL (ref 6.0–8.5)
eGFR: 87 mL/min/1.73 (ref >59)

## 2024-02-27 LAB — LIPID PANEL WITH LDL/HDL RATIO
Cholesterol, Total: 165 mg/dL (ref 100–199)
HDL: 45 mg/dL (ref >39)
LDL Chol Calc (NIH): 103 mg/dL — ABNORMAL HIGH (ref 0–99)
LDL/HDL Ratio: 2.3 ratio (ref 0.0–3.6)
Triglycerides: 89 mg/dL (ref 0–149)
VLDL Cholesterol Cal: 17 mg/dL (ref 5–40)

## 2024-02-27 LAB — PSA: Prostate Specific Ag, Serum: 1.2 ng/mL (ref 0.0–4.0)

## 2024-03-08 ENCOUNTER — Ambulatory Visit: Payer: Self-pay | Admitting: Family Medicine

## 2025-02-03 ENCOUNTER — Encounter: Admitting: Family Medicine
